# Patient Record
Sex: Female | Born: 1970 | Race: White | Hispanic: Refuse to answer | Marital: Married | Smoking: Never smoker
Health system: Southern US, Community
[De-identification: ages and names within clinical notes are randomized; demographics above are authoritative.]

## PROBLEM LIST (undated history)

## (undated) DIAGNOSIS — I10 Essential (primary) hypertension: Secondary | ICD-10-CM

## (undated) DIAGNOSIS — L0291 Cutaneous abscess, unspecified: Secondary | ICD-10-CM

## (undated) DIAGNOSIS — G43909 Migraine, unspecified, not intractable, without status migrainosus: Secondary | ICD-10-CM

## (undated) HISTORY — DX: Migraine, unspecified, not intractable, without status migrainosus: G43.909

## (undated) HISTORY — DX: Essential (primary) hypertension: I10

---

## 2004-04-24 HISTORY — PX: CHOLECYSTECTOMY: SHX55

## 2004-04-24 HISTORY — PX: TUBAL LIGATION: SHX77

## 2014-03-11 ENCOUNTER — Ambulatory Visit: Payer: Self-pay | Admitting: Physician Assistant

## 2014-06-24 LAB — HEMOGLOBIN A1C: Hgb A1c MFr Bld: 6 % (ref 4.0–6.0)

## 2014-08-06 ENCOUNTER — Encounter: Payer: Self-pay | Admitting: Family Medicine

## 2014-08-06 DIAGNOSIS — I10 Essential (primary) hypertension: Secondary | ICD-10-CM | POA: Insufficient documentation

## 2014-08-24 LAB — LIPID PANEL
Cholesterol: 268 mg/dL — AB (ref 0–200)
HDL: 62 mg/dL (ref 35–70)
LDL CALC: 184 mg/dL
TRIGLYCERIDES: 108 mg/dL (ref 40–160)

## 2014-11-24 ENCOUNTER — Encounter: Payer: Self-pay | Admitting: Family Medicine

## 2014-11-24 ENCOUNTER — Ambulatory Visit (INDEPENDENT_AMBULATORY_CARE_PROVIDER_SITE_OTHER): Payer: 59 | Admitting: Family Medicine

## 2014-11-24 VITALS — BP 102/64 | HR 72 | Ht 59.0 in | Wt 211.4 lb

## 2014-11-24 DIAGNOSIS — R7303 Prediabetes: Secondary | ICD-10-CM | POA: Insufficient documentation

## 2014-11-24 DIAGNOSIS — R7309 Other abnormal glucose: Secondary | ICD-10-CM

## 2014-11-24 DIAGNOSIS — E559 Vitamin D deficiency, unspecified: Secondary | ICD-10-CM | POA: Diagnosis not present

## 2014-11-24 DIAGNOSIS — I10 Essential (primary) hypertension: Secondary | ICD-10-CM

## 2014-11-24 DIAGNOSIS — R718 Other abnormality of red blood cells: Secondary | ICD-10-CM

## 2014-11-24 MED ORDER — LISINOPRIL-HYDROCHLOROTHIAZIDE 20-25 MG PO TABS: 1.0000 | ORAL_TABLET | Freq: Every day | ORAL | Status: DC

## 2014-11-24 NOTE — Progress Notes (Signed)
Date:  11/24/2014   Name:  Nicole Vargas   DOB:  [birth year]   MRN:  [id number]  PCP:  Schuyler Amor, MD    Chief Complaint: Hypertension   History of Present Illness:  This is a 44 y.o. female for f/u HTN, prediabetes, vit d deficiency. Rare lightheadness when leans over, otherwise tolerating well. Has not changed diet or exercise regimens. Taking vit d supplement daily.   Review of Systems:  Review of Systems  Respiratory: Negative for shortness of breath.   Cardiovascular: Negative for chest pain and leg swelling.    Patient Active Problem List   Diagnosis Date Noted  . Vitamin D deficiency 11/24/2014  . Prediabetes 11/24/2014  . Essential (primary) hypertension 08/06/2014  . Extreme obesity 08/06/2014    Prior to Admission medications   Medication Sig Start Date End Date Taking? Authorizing Provider  Cholecalciferol (VITAMIN D) 2000 UNITS tablet Take 2,000 Units by mouth daily.   Yes Historical Provider, MD  lisinopril-hydrochlorothiazide (PRINZIDE,ZESTORETIC) 20-25 MG per tablet Take 1 tablet by mouth daily. 11/24/14  Yes Schuyler Amor, MD    No Known Allergies  Past Surgical History  Procedure Laterality Date  . Tubal ligation  2006  . Cholecystectomy  2006  . Cesarean section  03/04/2000    History  Substance Use Topics  . Smoking status: Never Smoker   . Smokeless tobacco: Not on file  . Alcohol Use: No    Family History  Problem Relation Age of Onset  . Diabetes Maternal Grandmother   . Heart disease Paternal Grandfather     Medication list has been reviewed and updated.  Physical Examination: BP 102/64 mmHg  Pulse 72  Ht  (1.499 m)  Wt 211 lb 6.4 oz (95.89 kg)  BMI 42.67 kg/m2  LMP 10/23/2014 (Approximate)  Physical Exam  Constitutional: She appears well-developed and well-nourished.  Cardiovascular: Normal rate, regular rhythm and normal heart sounds.   Pulmonary/Chest: Effort normal and breath sounds normal.   Musculoskeletal: She exhibits no edema.    Assessment and Plan:  1. Essential (primary) hypertension Well controlled, continue current med, call if lightheadedness worsens - Basic Metabolic Panel (BMET)  2. Obesity, morbid, BMI 40.0-49.9 Discussed diet, weight loss - TSH  3. Vitamin D deficiency Continue supplement - Vitamin D (25 hydroxy)  4. Prediabetes Recheck a1c, discussed diabetes prevention strategies - HgB A1c  5. Microcytosis No anemia, check for iron deficiency - Ferritin  Return in about 6 months (around 05/27/2015).  Dionne Ano. Kingsley Spittle MD [facility]  11/24/2014

## 2014-11-25 LAB — BASIC METABOLIC PANEL WITH GFR
BUN/Creatinine Ratio: 21 (ref 9–23)
BUN: 16 mg/dL (ref 6–24)
CO2: 20 mmol/L (ref 18–29)
Calcium: 9.4 mg/dL (ref 8.7–10.2)
Chloride: 100 mmol/L (ref 97–108)
Creatinine, Ser: 0.75 mg/dL (ref 0.57–1.00)
GFR calc Af Amer: 114 mL/min/1.73
GFR calc non Af Amer: 99 mL/min/1.73
Glucose: 82 mg/dL (ref 65–99)
Potassium: 4.5 mmol/L (ref 3.5–5.2)
Sodium: 140 mmol/L (ref 134–144)

## 2014-11-25 LAB — TSH: TSH: 2.01 u[IU]/mL (ref 0.450–4.500)

## 2014-11-25 LAB — FERRITIN: Ferritin: 13 ng/mL — ABNORMAL LOW (ref 15–150)

## 2014-11-25 LAB — HEMOGLOBIN A1C
Est. average glucose Bld gHb Est-mCnc: 123 mg/dL
Hgb A1c MFr Bld: 5.9 % — ABNORMAL HIGH (ref 4.8–5.6)

## 2014-11-25 LAB — VITAMIN D 25 HYDROXY (VIT D DEFICIENCY, FRACTURES): Vit D, 25-Hydroxy: 27.5 ng/mL — ABNORMAL LOW (ref 30.0–100.0)

## 2015-07-05 ENCOUNTER — Ambulatory Visit (INDEPENDENT_AMBULATORY_CARE_PROVIDER_SITE_OTHER): Payer: 59 | Admitting: Family Medicine

## 2015-07-05 ENCOUNTER — Encounter: Payer: Self-pay | Admitting: Family Medicine

## 2015-07-05 VITALS — BP 119/76 | HR 100 | Ht 59.0 in | Wt 226.8 lb

## 2015-07-05 DIAGNOSIS — E559 Vitamin D deficiency, unspecified: Secondary | ICD-10-CM | POA: Diagnosis not present

## 2015-07-05 DIAGNOSIS — R0683 Snoring: Secondary | ICD-10-CM

## 2015-07-05 DIAGNOSIS — I10 Essential (primary) hypertension: Secondary | ICD-10-CM | POA: Diagnosis not present

## 2015-07-05 DIAGNOSIS — E611 Iron deficiency: Secondary | ICD-10-CM

## 2015-07-05 DIAGNOSIS — R1013 Epigastric pain: Secondary | ICD-10-CM | POA: Diagnosis not present

## 2015-07-05 DIAGNOSIS — R7303 Prediabetes: Secondary | ICD-10-CM

## 2015-07-05 DIAGNOSIS — M545 Low back pain: Secondary | ICD-10-CM | POA: Diagnosis not present

## 2015-07-05 HISTORY — DX: Iron deficiency: E61.1

## 2015-07-05 LAB — POCT URINALYSIS DIPSTICK
Bilirubin, UA: NEGATIVE
Blood, UA: NEGATIVE
GLUCOSE UA: NEGATIVE
Ketones, UA: NEGATIVE
Leukocytes, UA: NEGATIVE
NITRITE UA: NEGATIVE
Protein, UA: NEGATIVE
Spec Grav, UA: 1.01
UROBILINOGEN UA: 0.2
pH, UA: 7

## 2015-07-05 MED ORDER — VITAMIN D 50 MCG (2000 UT) PO CAPS: 1.0000 | ORAL_CAPSULE | Freq: Every day | ORAL | Status: AC

## 2015-07-05 MED ORDER — FERROUS SULFATE 325 (65 FE) MG PO TBEC: 325.0000 mg | DELAYED_RELEASE_TABLET | Freq: Every day | ORAL | Status: AC

## 2015-07-05 NOTE — Patient Instructions (Signed)

## 2015-07-06 LAB — CBC
HEMATOCRIT: 39 % (ref 34.0–46.6)
HEMOGLOBIN: 12.9 g/dL (ref 11.1–15.9)
MCH: 28.4 pg (ref 26.6–33.0)
MCHC: 33.1 g/dL (ref 31.5–35.7)
MCV: 86 fL (ref 79–97)
Platelets: 290 10*3/uL (ref 150–379)
RBC: 4.54 x10E6/uL (ref 3.77–5.28)
RDW: 13.7 % (ref 12.3–15.4)
WBC: 9 10*3/uL (ref 3.4–10.8)

## 2015-07-06 LAB — COMPREHENSIVE METABOLIC PANEL
ALBUMIN: 4.1 g/dL (ref 3.5–5.5)
ALK PHOS: 32 IU/L — AB (ref 39–117)
ALT: 13 IU/L (ref 0–32)
AST: 13 IU/L (ref 0–40)
Albumin/Globulin Ratio: 1.6 (ref 1.2–2.2)
BUN/Creatinine Ratio: 18 (ref 9–23)
BUN: 12 mg/dL (ref 6–24)
Bilirubin Total: 0.2 mg/dL (ref 0.0–1.2)
CALCIUM: 9.4 mg/dL (ref 8.7–10.2)
CO2: 25 mmol/L (ref 18–29)
CREATININE: 0.68 mg/dL (ref 0.57–1.00)
Chloride: 101 mmol/L (ref 96–106)
GFR calc Af Amer: 124 mL/min/{1.73_m2} (ref >59)
GFR calc non Af Amer: 107 mL/min/{1.73_m2} (ref >59)
Globulin, Total: 2.5 g/dL (ref 1.5–4.5)
Glucose: 96 mg/dL (ref 65–99)
Potassium: 4.6 mmol/L (ref 3.5–5.2)
Sodium: 141 mmol/L (ref 134–144)
Total Protein: 6.6 g/dL (ref 6.0–8.5)

## 2015-07-06 LAB — HEMOGLOBIN A1C
Est. average glucose Bld gHb Est-mCnc: 120 mg/dL
Hgb A1c MFr Bld: 5.8 % — ABNORMAL HIGH (ref 4.8–5.6)

## 2015-07-06 LAB — FERRITIN: FERRITIN: 25 ng/mL (ref 15–150)

## 2015-07-06 LAB — VITAMIN D 25 HYDROXY (VIT D DEFICIENCY, FRACTURES): VIT D 25 HYDROXY: 24.7 ng/mL — AB (ref 30.0–100.0)

## 2015-07-06 NOTE — Progress Notes (Signed)
Date:  07/05/2015   Name:  Nicole Vargas   DOB:  1970/05/20   MRN:  [id number]  PCP:  Schuyler Amor, MD    Chief Complaint: Abdominal Pain   History of Present Illness:  This is a 45 y.o. female with epigastric pain this morning after eating breakfast, some radiation to upper back, no associated cardiac sxs, no similar prior sxs. Better now without intervention. Hx HTN well controlled on Prinzide (lightheadedness resolved), vit D def (not taking supplement currently), prediabetes (a1c 5.9% in August), and iron def (not taking supplement currently). Also c/o snoring loudly at night and being tired all the time.  Review of Systems:  Review of Systems  Constitutional: Negative for fever.  Respiratory: Negative for shortness of breath.   Cardiovascular: Negative for chest pain and leg swelling.  Gastrointestinal: Negative for nausea, vomiting, diarrhea, constipation and abdominal distention.  Endocrine: Negative for polyuria.  Genitourinary: Negative for difficulty urinating.  Neurological: Negative for syncope and light-headedness.    Patient Active Problem List   Diagnosis Date Noted  . Iron deficiency 07/05/2015  . Vitamin D deficiency 11/24/2014  . Prediabetes 11/24/2014  . Essential (primary) hypertension 08/06/2014  . Obesity, Class III, BMI 40-49.9 (morbid obesity) (HCC) 08/06/2014    Prior to Admission medications   Medication Sig Start Date End Date Taking? Authorizing Provider  lisinopril-hydrochlorothiazide (PRINZIDE,ZESTORETIC) 20-25 MG per tablet Take 1 tablet by mouth daily. 11/24/14  Yes Schuyler Amor, MD  Cholecalciferol (VITAMIN D) 2000 units CAPS Take 1 capsule (2,000 Units total) by mouth daily. 07/05/15   Schuyler Amor, MD  ferrous sulfate 325 (65 FE) MG EC tablet Take 1 tablet (325 mg total) by mouth daily with breakfast. 07/05/15   Schuyler Amor, MD    No Known Allergies  Past Surgical History  Procedure Laterality Date  . Tubal ligation  2006  .  Cholecystectomy  2006  . Cesarean section  03/04/2000    Social History  Substance Use Topics  . Smoking status: Never Smoker   . Smokeless tobacco: None  . Alcohol Use: No    Family History  Problem Relation Age of Onset  . Diabetes Maternal Grandmother   . Heart disease Paternal Grandfather     Medication list has been reviewed and updated.  Physical Examination: BP 119/76 mmHg  Pulse 100  Ht  (1.499 m)  Wt 226 lb 12.8 oz (102.876 kg)  BMI 45.78 kg/m2  LMP 06/24/2015  Physical Exam  Constitutional: She appears well-developed and well-nourished.  Cardiovascular: Normal rate, regular rhythm and normal heart sounds.   Pulmonary/Chest: Effort normal and breath sounds normal.  Abdominal: Soft. She exhibits no distension and no mass. There is no rebound and no guarding.  Mild suprapubic tenderness  Musculoskeletal: She exhibits no edema.  Neurological: She is alert.  Skin: Skin is warm and dry.  Psychiatric: She has a normal mood and affect. Her behavior is normal.  Nursing note and vitals reviewed.   Assessment and Plan:  1. Epigastric pain Unclear etiology, possible GB dz but acute and resolving spontaneously, recommend antacids prn, consider [place] if sxs recur  2. Low back pain, unspecified back pain laterality, with sciatica presence unspecified UA negative - POCT Urinalysis Dipstick - CBC  3. Essential (primary) hypertension Well controlled on Prinzide - Comprehensive Metabolic Panel (CMET)  4. Obesity, Class III, BMI 40-49.9 (morbid obesity) (HCC) Weight up 15# from last visit, exercise/weight loss discussed  5. Vitamin D deficiency Restart supplement - Vitamin D (25 hydroxy)  6. Prediabetes - HgB A1c  7. Iron deficiency Restart supplement - Ferritin  8. Snoring With daytime fatigue - Ambulatory referral to Sleep Studies  Return in about 6 months (around 01/05/2016).  Dionne AnoWilliam M. Kingsley SpittlePlonk, Jr. MD [facility]  07/06/2015

## 2015-07-20 ENCOUNTER — Telehealth: Payer: Self-pay

## 2015-07-20 NOTE — Telephone Encounter (Signed)
Sent to [facility] 

## 2015-10-12 ENCOUNTER — Ambulatory Visit (INDEPENDENT_AMBULATORY_CARE_PROVIDER_SITE_OTHER): Payer: 59 | Admitting: Family Medicine

## 2015-10-12 ENCOUNTER — Encounter: Payer: Self-pay | Admitting: Family Medicine

## 2015-10-12 VITALS — BP 98/78 | HR 81 | Temp 98.6°F | Resp 16 | Ht 59.0 in | Wt 226.0 lb

## 2015-10-12 DIAGNOSIS — R7303 Prediabetes: Secondary | ICD-10-CM | POA: Diagnosis not present

## 2015-10-12 DIAGNOSIS — I10 Essential (primary) hypertension: Secondary | ICD-10-CM | POA: Diagnosis not present

## 2015-10-12 DIAGNOSIS — E611 Iron deficiency: Secondary | ICD-10-CM

## 2015-10-12 DIAGNOSIS — E559 Vitamin D deficiency, unspecified: Secondary | ICD-10-CM | POA: Diagnosis not present

## 2015-10-12 DIAGNOSIS — L723 Sebaceous cyst: Secondary | ICD-10-CM

## 2015-10-12 MED ORDER — LISINOPRIL-HYDROCHLOROTHIAZIDE 20-25 MG PO TABS: 1.0000 | ORAL_TABLET | Freq: Every day | ORAL | Status: DC

## 2015-10-12 NOTE — Progress Notes (Signed)
Date:  10/12/2015   Name:  Nicole Vargas   DOB:  03/28/1971   MRN:  [id number]  PCP:  Schuyler AmorWilliam Bradyn Soward, MD    Chief Complaint: Mass   History of Present Illness:  This is a 45 y.o. female with mass R axilla past month, stable, not painful. Taking Prinzide for HTN and vit D and iron supplements. Periods about the same.   Review of Systems:  Review of Systems  Constitutional: Negative for fever and fatigue.  Respiratory: Negative for cough and shortness of breath.   Cardiovascular: Negative for chest pain and leg swelling.  Endocrine: Negative for polyuria.  Genitourinary: Negative for difficulty urinating.  Neurological: Negative for syncope and light-headedness.    Patient Active Problem List   Diagnosis Date Noted  . Iron deficiency 07/05/2015  . Vitamin D deficiency 11/24/2014  . Prediabetes 11/24/2014  . Essential (primary) hypertension 08/06/2014  . Obesity, Class III, BMI 40-49.9 (morbid obesity) (HCC) 08/06/2014    Prior to Admission medications   Medication Sig Start Date End Date Taking? Authorizing Provider  Cholecalciferol (VITAMIN D) 2000 units CAPS Take 1 capsule (2,000 Units total) by mouth daily. 07/05/15  Yes Schuyler AmorWilliam Pierce Biagini, MD  ferrous sulfate 325 (65 FE) MG EC tablet Take 1 tablet (325 mg total) by mouth daily with breakfast. 07/05/15  Yes Schuyler AmorWilliam Joneisha Miles, MD  lisinopril-hydrochlorothiazide (PRINZIDE,ZESTORETIC) 20-25 MG tablet Take 1 tablet by mouth daily. 10/12/15  Yes Schuyler AmorWilliam Furious Chiarelli, MD    No Known Allergies  Past Surgical History  Procedure Laterality Date  . Tubal ligation  2006  . Cholecystectomy  2006  . Cesarean section  03/04/2000    Social History  Substance Use Topics  . Smoking status: Never Smoker   . Smokeless tobacco: None  . Alcohol Use: No    Family History  Problem Relation Age of Onset  . Diabetes Maternal Grandmother   . Heart disease Paternal Grandfather     Medication list has been reviewed and updated.  Physical  Examination: BP 98/78 mmHg  Pulse 81  Temp(Src) 98.6 F (37 C) (Oral)  Resp 16  Ht 4\' 11"  (1.499 m)  Wt 226 lb (102.513 kg)  BMI 45.62 kg/m2  SpO2 96%  Physical Exam  Constitutional: She appears well-developed and well-nourished.  Cardiovascular: Normal rate, regular rhythm and normal heart sounds.   Pulmonary/Chest: Effort normal and breath sounds normal.  Musculoskeletal: She exhibits no edema.  Neurological: She is alert.  Skin: Skin is warm and dry.  1 cm cyst R axilla, nontender, mild erythema  Psychiatric: She has a normal mood and affect. Her behavior is normal.  Nursing note and vitals reviewed.   Assessment and Plan:  1. Sebaceous cyst of right axilla Stable, sxs infection discussed  2. Essential (primary) hypertension Well controlled on Prinzide  3. Prediabetes - HgB A1c  4. Vitamin D deficiency On supplement - Vitamin D (25 hydroxy)  5. Iron deficiency On supplement - Ferritin  6. Obesity, Class III, BMI 40-49.9 (morbid obesity) (HCC) Stable, exercise/weight loss discussed  Return in about 6 months (around 04/12/2016).  Dionne AnoWilliam M. Kingsley SpittlePlonk, Jr. MD [facility]  10/12/2015

## 2015-10-13 LAB — HEMOGLOBIN A1C
Est. average glucose Bld gHb Est-mCnc: 111 mg/dL
HEMOGLOBIN A1C: 5.5 % (ref 4.8–5.6)

## 2015-10-13 LAB — FERRITIN: Ferritin: 67 ng/mL (ref 15–150)

## 2015-10-13 LAB — VITAMIN D 25 HYDROXY (VIT D DEFICIENCY, FRACTURES): Vit D, 25-Hydroxy: 34.8 ng/mL (ref 30.0–100.0)

## 2015-12-10 ENCOUNTER — Ambulatory Visit
Admission: EM | Admit: 2015-12-10 | Discharge: 2015-12-10 | Disposition: A | Discharge status: Home / Self Care | Payer: 59 | Attending: Internal Medicine | Admitting: Internal Medicine

## 2015-12-10 DIAGNOSIS — L03312 Cellulitis of back [any part except buttock]: Secondary | ICD-10-CM

## 2015-12-10 MED ORDER — SULFAMETHOXAZOLE-TRIMETHOPRIM 800-160 MG PO TABS: 1.0000 | ORAL_TABLET | Freq: Two times a day (BID) | ORAL | 0 refills | Status: DC

## 2015-12-10 NOTE — ED Triage Notes (Signed)
Patient complains of skin lesion located on left mid back near bra line. Patient states that area has been painful,swollen and red. Patient denies any recent insect bites.

## 2015-12-10 NOTE — Discharge Instructions (Signed)
Put heat on sore spot, 10-15 minutes at a time 2-4 times daily, to see if it will help resolve the sore spot, or bring it to a head.  Prescription for antibiotic sent to the [facility] in [place].  Recheck or followup with PCP/Dr Judithann GravesBerglund if not improving in a few days.

## 2015-12-10 NOTE — ED Provider Notes (Signed)
[facility]    CSN: [id number] Arrival date & time: 12/10/15  [id number]  First Provider Contact:  First MD Initiated Contact with Patient 12/10/15 1023        History   Chief Complaint Chief Complaint  Patient presents with  . Skin Problem    HPI Nicole Vargas is a 45 y.o. female. She presents today with a 2-3 day history of sore red place in her left mid back, in the bra line. 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 No fever, no rash or sore places elsewhere. No malaise. The lesion is not resolving, so she is here to have it checked out. No prior history of boils or shingles.  HPI  Past Medical History:  Diagnosis Date  . Hypertension     Patient Active Problem List   Diagnosis Date Noted  . Iron deficiency 07/05/2015  . Vitamin D deficiency 11/24/2014  . Prediabetes 11/24/2014  . Essential (primary) hypertension 08/06/2014  . Obesity, Class III, BMI 40-49.9 (morbid obesity) (HCC) 08/06/2014    Past Surgical History:  Procedure Laterality Date  . CESAREAN SECTION  03/04/2000  . CHOLECYSTECTOMY  2006  . TUBAL LIGATION  2006     Home Medications    Prior to Admission medications   Medication Sig Start Date End Date Taking? Authorizing Provider  Cholecalciferol (VITAMIN D) 2000 units CAPS Take 1 capsule (2,000 Units total) by mouth daily. 07/05/15  Yes Schuyler AmorWilliam [facility], MD  ferrous sulfate 325 (65 FE) MG EC tablet Take 1 tablet (325 mg total) by mouth daily with breakfast. 07/05/15  Yes Schuyler AmorWilliam [facility], MD  lisinopril-hydrochlorothiazide (PRINZIDE,ZESTORETIC) 20-25 MG tablet Take 1 tablet by mouth daily. 10/12/15  Yes Schuyler AmorWilliam [facility], MD    Family History Family History  Problem Relation Age of Onset  . Diabetes Maternal Grandmother   . Heart disease Paternal Grandfather     Social History Social History  Substance Use Topics  . Smoking status: Never Smoker  .  Smokeless tobacco: Never Used  . Alcohol use No     Allergies   Review of patient's allergies indicates no known allergies.   Review of Systems Review of Systems  All other systems reviewed and are negative.    Physical Exam Triage Vital Signs ED Triage Vitals  Enc Vitals Group     BP 12/10/15 1006 110/67     Pulse Rate 12/10/15 1006 82     Resp 12/10/15 1006 17     Temp 12/10/15 1006 98.3 F (36.8 C)     Temp Source 12/10/15 1006 Tympanic     SpO2 12/10/15 1006 98 %     Weight 12/10/15 1003 223 lb (101.2 kg)     Height 12/10/15 1003 4\' 11"  (1.499 m)     Pain Score 12/10/15 1005 3   Updated Vital Signs BP 110/67 (BP Location: Left Arm)   Pulse 82   Temp 98.3 F (36.8 C) (Tympanic)   Resp 17   Ht 4\' 11"  (1.499 m)   Wt 223 lb (101.2 kg)   LMP 11/28/2015   SpO2 98%   BMI 45.04 kg/m  Physical Exam  Constitutional: She is oriented to person, place, and time. No distress.  Alert, nicely groomed  HENT:  Head: Atraumatic.  Eyes:  Conjugate gaze, no eye redness/drainage  Neck: Neck supple.  Cardiovascular: Normal rate.   Pulmonary/Chest: No respiratory distress.  Abdominal:  She exhibits no distension.  Musculoskeletal: Normal range of motion.  No leg swelling  Neurological: She is alert and oriented to person, place, and time.  Skin: Skin is warm and dry.  No cyanosis 2 inch indurated red patch in the left bra line posteriorly, with surrounding zone of erythema that extends to 3 inches total. Couple of inflamed blackheads in the periphery of this lesion, but not pointing, not fluctuant. It is tender. No other rash or lesion appreciated.  Nursing note and vitals reviewed.    UC Treatments / Results   Procedures Procedures (including critical care time) none  Final Clinical Impressions(s) / UC Diagnoses   Final diagnoses:  Cellulitis of mid back region   Apply heat for 10-15 minutes 2-4 times daily, to try to improve the lesion or bring it to a head.  Recheck in a few days, or follow up with PCP, if not improving.  New Prescriptions Discharge Medication List as of 12/10/2015 10:35 AM    START taking these medications   Details  sulfamethoxazole-trimethoprim (BACTRIM DS,SEPTRA DS) 800-160 MG tablet Take 1 tablet by mouth 2 (two) times daily., Starting Fri 12/10/2015, Until Fri 12/17/2015, Normal         Eustace MooreLaura W Jerzy Crotteau, MD 12/10/15 [phone]

## 2015-12-14 ENCOUNTER — Ambulatory Visit
Admission: EM | Admit: 2015-12-14 | Discharge: 2015-12-14 | Disposition: A | Discharge status: Home / Self Care | Payer: 59 | Attending: Family Medicine | Admitting: Family Medicine

## 2015-12-14 DIAGNOSIS — L0390 Cellulitis, unspecified: Secondary | ICD-10-CM

## 2015-12-14 DIAGNOSIS — L0291 Cutaneous abscess, unspecified: Secondary | ICD-10-CM | POA: Diagnosis not present

## 2015-12-14 MED ORDER — MUPIROCIN 2 % EX OINT: 1.0000 "application " | TOPICAL_OINTMENT | Freq: Three times a day (TID) | CUTANEOUS | 0 refills | Status: DC

## 2015-12-14 MED ORDER — CLINDAMYCIN HCL 300 MG PO CAPS: 300.0000 mg | ORAL_CAPSULE | Freq: Three times a day (TID) | ORAL | 0 refills | Status: DC

## 2015-12-14 NOTE — ED Provider Notes (Signed)
CSN: [id number]     Arrival date & time 12/14/15  0941 History   None    Chief Complaint  Patient presents with  . Recurrent Skin Infections    Back   (Consider location/radiation/quality/duration/timing/severity/associated sxs/prior Treatment) HPI  This is a 45 year old female returns after being seen on 12/10/2015 by Dr. Dayton ScrapeMurray. At that time a abscess on her back had not pointed and was not fluctuant and therefore not amenable to I&D. Place on warm compresses which she has been performing 2-3 times a day and on Septra DS which she has taken but missed the dose this morning. She states that despite all the instructions as she feels that it is and isn't improving and in fact is worsening. She denies any fever or chills. She attempted to proceed Dr Hollace HaywardPlonk. today but they sent her down here because they don't perform I&D's in their office.    Past Medical History:  Diagnosis Date  . Hypertension    Past Surgical History:  Procedure Laterality Date  . CESAREAN SECTION  03/04/2000  . CHOLECYSTECTOMY  2006  . TUBAL LIGATION  2006   Family History  Problem Relation Age of Onset  . Diabetes Maternal Grandmother   . Heart disease Paternal Grandfather    Social History  Substance Use Topics  . Smoking status: Never Smoker  . Smokeless tobacco: Never Used  . Alcohol use No   OB History    No data available     Review of Systems  Constitutional: Positive for activity change. Negative for chills, fatigue and fever.  Skin: Positive for wound.  All other systems reviewed and are negative.   Allergies  Review of patient's allergies indicates no known allergies.  Home Medications   Prior to Admission medications   Medication Sig Start Date End Date Taking? Authorizing Provider  Cholecalciferol (VITAMIN D) 2000 units CAPS Take 1 capsule (2,000 Units total) by mouth daily. 07/05/15   Schuyler AmorWilliam [facility], MD  clindamycin (CLEOCIN) 300 MG capsule Take 1 capsule (300 mg total) by mouth 3  (three) times daily. 12/14/15   Lutricia FeilWilliam P Venida Tsukamoto, PA-C  ferrous sulfate 325 (65 FE) MG EC tablet Take 1 tablet (325 mg total) by mouth daily with breakfast. 07/05/15   Schuyler AmorWilliam [facility], MD  lisinopril-hydrochlorothiazide (PRINZIDE,ZESTORETIC) 20-25 MG tablet Take 1 tablet by mouth daily. 10/12/15   Schuyler AmorWilliam [facility], MD  mupirocin ointment (BACTROBAN) 2 % Apply 1 application topically 3 (three) times daily. 12/14/15   Lutricia FeilWilliam P Marston Mccadden, PA-C  sulfamethoxazole-trimethoprim (BACTRIM DS,SEPTRA DS) 800-160 MG tablet Take 1 tablet by mouth 2 (two) times daily. 12/10/15 12/17/15  Eustace MooreLaura W Murray, MD   Meds Ordered and Administered this Visit  Medications - No data to display  BP 110/74 (BP Location: Left Arm)   Pulse 95   Resp 18   Ht 4\' 11"  (1.499 m)   Wt 223 lb (101.2 kg)   LMP 11/28/2015   SpO2 99%   BMI 45.04 kg/m  No data found.   Physical Exam  Constitutional: She is oriented to person, place, and time. She appears well-developed and well-nourished. No distress.  HENT:  Head: Normocephalic and atraumatic.  Eyes: EOM are normal. Pupils are equal, round, and reactive to light.  Neck: Normal range of motion. Neck supple.  Musculoskeletal: Normal range of motion. She exhibits no edema, tenderness or deformity.  Neurological: She is alert and oriented to person, place, and time.  Skin: Skin is warm and dry. She is not diaphoretic.  Examination was  performed with Herbert SetaHeather, RN as Nurse, children'schaperone and assistant. The abscess over the lateral posterior chest in the posterior axillary line has a small pustule over the midportion. Is still indurated particularly superiorly and very erythematous in an area measuring approximately 5 cm x 4 cm. Is no fluctuance present. Is in the bra line and because of her pendulous breasts is irritating the area. She states that she is faithfully taking the Septra but does not seem to be responding well. The area has not pointed sufficiently enough for I&D.  Psychiatric: She has a normal  mood and affect. Her behavior is normal. Judgment and thought content normal.  Nursing note and vitals reviewed.   Urgent Care Course   Clinical Course    Procedures (including critical care time)  Labs Review Labs Reviewed - No data to display  Imaging Review No results found.   Visual Acuity Review  Right Eye Distance:   Left Eye Distance:   Bilateral Distance:    Right Eye Near:   Left Eye Near:    Bilateral Near:     Procedure: The abscess was thoroughly cleansed with Hibiclens shin. Gauge needle was then utilized to remove the pustule at the return of a small amount of purulent and serosanguineous fluid. The area was squeezed without any further purulence being pressed. The area of erythema extends beyond the area of induration is warm tender and blanchable.    MDM   1. Abscess and cellulitis    Discharge Medication List as of 12/14/2015 12:02 PM    START taking these medications   Details  clindamycin (CLEOCIN) 300 MG capsule Take 1 capsule (300 mg total) by mouth 3 (three) times daily., Starting Tue 12/14/2015, Normal    mupirocin ointment (BACTROBAN) 2 % Apply 1 application topically 3 (three) times daily., Starting Tue 12/14/2015, Normal      Plan: 1. Test/x-ray results and diagnosis reviewed with patient 2. rx as per orders; risks, benefits, potential side effects reviewed with patient 3. Recommend supportive treatment with continued aggressive warm compresses 4 times daily lasting 10 minutes each time. She will then dry the area and apply Bactroban after each warm compress. I'll continue her with the Septra but in order to augment resolution of the abscess I will place her on clindamycin 300 mg 3 times a day. She states that she is not able to work and not wear a bra so I will give her 2 days off work until I see her back in 48 hours. Should help with the irritation caused by the bra. 4. F/u prn if symptoms worsen or don't improve     Lutricia FeilWilliam P Albert Hersch,  PA-C 12/14/15 [address][phone]    Keiston Manley P Sw[place] MeadowsRoemer, [place]PA-C 12/14/15 1216

## 2015-12-14 NOTE — ED Triage Notes (Signed)
Patient was seen on Friday for a boil on her back, she was given an antibiotic and she has been taking it correctly. She was told if it wasn't better to come back on Tuesday. Patient states that the boil has started to spread and hurt worse.

## 2015-12-16 ENCOUNTER — Encounter: Payer: Self-pay | Admitting: *Deleted

## 2015-12-16 ENCOUNTER — Ambulatory Visit
Admission: EM | Admit: 2015-12-16 | Discharge: 2015-12-16 | Disposition: A | Discharge status: Home / Self Care | Payer: 59 | Attending: Family Medicine | Admitting: Family Medicine

## 2015-12-16 DIAGNOSIS — I10 Essential (primary) hypertension: Secondary | ICD-10-CM | POA: Diagnosis not present

## 2015-12-16 DIAGNOSIS — L0390 Cellulitis, unspecified: Secondary | ICD-10-CM | POA: Insufficient documentation

## 2015-12-16 DIAGNOSIS — L0291 Cutaneous abscess, unspecified: Secondary | ICD-10-CM | POA: Diagnosis not present

## 2015-12-16 MED ORDER — SULFAMETHOXAZOLE-TRIMETHOPRIM 800-160 MG PO TABS: 1.0000 | ORAL_TABLET | Freq: Two times a day (BID) | ORAL | 0 refills | Status: AC

## 2015-12-16 NOTE — ED Provider Notes (Signed)
CSN: [id number]     Arrival date & time 12/16/15  0840 History   First MD Initiated Contact with Patient 12/16/15 [phone]     Chief Complaint  Patient presents with  . Abscess   (Consider location/radiation/quality/duration/timing/severity/associated sxs/prior Treatment) HPI  This 45 year old female presents after being seen 2 days ago with a large abscess and cellulitis of her left posterior thorax was placed on warm compresses and additional antibiotic medication including Bactroban ointment and clindamycin by mouth. Is been following my instructions and returns today for follow-up. The abscess has now begun to point with a large amount of fluctuance felt and less induration. Is also much less tender although the cellulitis appears to have not decreased but certainly has not increased in size. She is afebrile   Past Medical History:  Diagnosis Date  . Hypertension    Past Surgical History:  Procedure Laterality Date  . CESAREAN SECTION  03/04/2000  . CHOLECYSTECTOMY  2006  . TUBAL LIGATION  2006   Family History  Problem Relation Age of Onset  . Diabetes Maternal Grandmother   . Heart disease Paternal Grandfather    Social History  Substance Use Topics  . Smoking status: Never Smoker  . Smokeless tobacco: Never Used  . Alcohol use No   OB History    No data available     Review of Systems  Constitutional: Positive for activity change. Negative for chills, fatigue and fever.  Skin: Positive for color change and wound.  All other systems reviewed and are negative.   Allergies  Review of patient's allergies indicates no known allergies.  Home Medications   Prior to Admission medications   Medication Sig Start Date End Date Taking? Authorizing Provider  Cholecalciferol (VITAMIN D) 2000 units CAPS Take 1 capsule (2,000 Units total) by mouth daily. 07/05/15  Yes Schuyler AmorWilliam Plonk, MD  clindamycin (CLEOCIN) 300 MG capsule Take 1 capsule (300 mg total) by mouth 3 (three) times  daily. 12/14/15  Yes Lutricia FeilWilliam P Lacorey Brusca, PA-C  ferrous sulfate 325 (65 FE) MG EC tablet Take 1 tablet (325 mg total) by mouth daily with breakfast. 07/05/15  Yes Schuyler AmorWilliam Plonk, MD  lisinopril-hydrochlorothiazide (PRINZIDE,ZESTORETIC) 20-25 MG tablet Take 1 tablet by mouth daily. 10/12/15  Yes Schuyler AmorWilliam Plonk, MD  mupirocin ointment (BACTROBAN) 2 % Apply 1 application topically 3 (three) times daily. 12/14/15  Yes Lutricia FeilWilliam P Elysa Womac, PA-C  sulfamethoxazole-trimethoprim (BACTRIM DS,SEPTRA DS) 800-160 MG tablet Take 1 tablet by mouth 2 (two) times daily. 12/16/15 12/19/15  Lutricia FeilWilliam P Brennley Curtice, PA-C   Meds Ordered and Administered this Visit  Medications - No data to display  BP 115/83 (BP Location: Left Arm)   Pulse 82   Temp 97.7 F (36.5 C) (Oral)   Resp 16   Ht 4\' 10"  (1.473 m)   Wt 230 lb (104.3 kg)   LMP 11/28/2015   SpO2 100%   BMI 48.07 kg/m  No data found.   Physical Exam  Constitutional: She is oriented to person, place, and time. She appears well-developed and well-nourished. No distress.  HENT:  Head: Normocephalic and atraumatic.  Eyes: EOM are normal. Pupils are equal, round, and reactive to light.  Neck: Normal range of motion. Neck supple.  Musculoskeletal: Normal range of motion.  Neurological: She is alert and oriented to person, place, and time.  Skin: Skin is warm and dry. Rash noted. She is not diaphoretic. There is erythema.  Psychiatric: She has a normal mood and affect. Her behavior is normal. Judgment and thought  content normal.  Nursing note and vitals reviewed.   Urgent Care Course   Clinical Course    .Marland Kitchen.Incision and Drainage Date/Time: 12/16/2015 10:13 AM Performed by: Lutricia FeilOEMER, Tillman Kazmierski P Authorized by: Hassan RowanWADE, EUGENE   Consent:    Consent obtained:  Verbal   Consent given by:  Patient   Risks discussed:  Incomplete drainage and infection   Alternatives discussed:  Alternative treatment Location:    Type:  Abscess   Size:  6x8   Location:  Trunk   Trunk  location:  Back Pre-procedure details:    Skin preparation:  Betadine Anesthesia (see MAR for exact dosages):    Anesthesia method:  Local infiltration   Local anesthetic:  Lidocaine 1% w/o epi Procedure type:    Complexity:  Complex Procedure details:    Needle aspiration: no     Incision types:  Single straight   Incision depth:  Fascia   Scalpel blade:  11   Wound management:  Probed and deloculated   Drainage:  Bloody and purulent   Drainage amount:  Moderate   Wound treatment:  Drain placed and wound left open   Packing materials:  1/4 in gauze   Amount 1/4":  3 inches Post-procedure details:    Patient tolerance of procedure:  Tolerated well, no immediate complications Comments:     Encourage patient to keep dry for 48 hours. Is covered with Tegaderm to assist her with showering. Is given extra Tegaderm if the wound became overly so from drainage and she will change it out at that time. She'll return in 48 hours for reevaluation. Introduced her to Dr. Thurmond ButtsWade who will assume her care from here on. Cultures were obtained and sent to the lab for cultures and sensitivities. Continue her Septra DS for another 3 days for a total of 10 and she continues to take her clindamycin until results of sensitivities are available.    (including critical care time)  Labs Review Labs Reviewed  AEROBIC/ANAEROBIC CULTURE (SURGICAL/DEEP WOUND)    Imaging Review No results found.   Visual Acuity Review  Right Eye Distance:   Left Eye Distance:   Bilateral Distance:    Right Eye Near:   Left Eye Near:    Bilateral Near:         MDM   1. Abscess and cellulitis    Current Discharge Medication List    Plan: 1. Test/x-ray results and diagnosis reviewed with patient 2. rx as per orders; risks, benefits, potential side effects reviewed with patient 3. Recommend supportive treatment with Keeping area dry for48 hours. She was given extra Tegaderm for dressing changes if it does become  overly soiled. She will return in 48 hours for follow-up. Additional Septra was provided to her for a total of 10 days therapy along with the clindamycin until sensitivities are available 4. F/u prn if symptoms worsen or don't improve     Lutricia FeilWilliam P Kin Galbraith, PA-C 12/16/15 1022

## 2015-12-16 NOTE — ED Triage Notes (Signed)
Recheck of abcess on back.

## 2015-12-18 ENCOUNTER — Ambulatory Visit
Admission: EM | Admit: 2015-12-18 | Discharge: 2015-12-18 | Disposition: A | Discharge status: Home / Self Care | Payer: 59 | Attending: Family Medicine | Admitting: Family Medicine

## 2015-12-18 ENCOUNTER — Encounter: Payer: Self-pay | Admitting: Gynecology

## 2015-12-18 DIAGNOSIS — IMO0001 Reserved for inherently not codable concepts without codable children: Secondary | ICD-10-CM

## 2015-12-18 DIAGNOSIS — T814XXD Infection following a procedure, subsequent encounter: Secondary | ICD-10-CM

## 2015-12-18 HISTORY — DX: Cutaneous abscess, unspecified: L02.91

## 2015-12-18 NOTE — ED Triage Notes (Signed)
Patient is here for recheck of abscess and cellulitis of mid back region.

## 2015-12-18 NOTE — ED Provider Notes (Signed)
[facility]    CSN: [id number] Arrival date & time: 12/18/15  [id number]  First Provider Contact:  First MD Initiated Contact with Patient 12/18/15 [phone]        History   Chief Complaint Chief Complaint  Patient presents with  . Cellulitis    HPI Nicole Vargas is a 45 y.o. female.   Patient's here for a follow-up of abscess opening. She reports some site is much improved pain is much better she is doing much better since she's opening. She's been able to go to work as well. She reports the drainage is as well.   The history is provided by the patient. No language interpreter was used.  Wound Check  This is a recurrent problem. The current episode started more than 2 days ago. The problem has been rapidly improving. Pertinent negatives include no chest pain, no abdominal pain, no headaches and no shortness of breath. Nothing aggravates the symptoms. She has tried nothing for the symptoms. The treatment provided significant relief.    Past Medical History:  Diagnosis Date  . Abscess   . Hypertension     Patient Active Problem List   Diagnosis Date Noted  . Iron deficiency 07/05/2015  . Vitamin D deficiency 11/24/2014  . Prediabetes 11/24/2014  . Essential (primary) hypertension 08/06/2014  . Obesity, Class III, BMI 40-49.9 (morbid obesity) (HCC) 08/06/2014    Past Surgical History:  Procedure Laterality Date  . CESAREAN SECTION  03/04/2000  . CHOLECYSTECTOMY  2006  . TUBAL LIGATION  2006    OB History    No data available       Home Medications    Prior to Admission medications   Medication Sig Start Date End Date Taking? Authorizing Provider  Cholecalciferol (VITAMIN D) 2000 units CAPS Take 1 capsule (2,000 Units total) by mouth daily. 07/05/15  Yes Schuyler AmorWilliam Plonk, MD  clindamycin (CLEOCIN) 300 MG capsule Take 1 capsule (300 mg total) by mouth 3 (three) times daily. 12/14/15  Yes Lutricia FeilWilliam P Roemer, PA-C  ferrous sulfate 325 (65 FE) MG EC  tablet Take 1 tablet (325 mg total) by mouth daily with breakfast. 07/05/15  Yes Schuyler AmorWilliam Plonk, MD  lisinopril-hydrochlorothiazide (PRINZIDE,ZESTORETIC) 20-25 MG tablet Take 1 tablet by mouth daily. 10/12/15  Yes Schuyler AmorWilliam Plonk, MD  mupirocin ointment (BACTROBAN) 2 % Apply 1 application topically 3 (three) times daily. 12/14/15  Yes Lutricia FeilWilliam P Roemer, PA-C  sulfamethoxazole-trimethoprim (BACTRIM DS,SEPTRA DS) 800-160 MG tablet Take 1 tablet by mouth 2 (two) times daily. 12/16/15 12/19/15 Yes Lutricia FeilWilliam P Roemer, PA-C    Family History Family History  Problem Relation Age of Onset  . Diabetes Maternal Grandmother   . Heart disease Paternal Grandfather     Social History Social History  Substance Use Topics  . Smoking status: Never Smoker  . Smokeless tobacco: Never Used  . Alcohol use No     Allergies   Review of patient's allergies indicates no known allergies.   Review of Systems Review of Systems  Respiratory: Negative for shortness of breath.   Cardiovascular: Negative for chest pain.  Gastrointestinal: Negative for abdominal pain.  Neurological: Negative for headaches.  All other systems reviewed and are negative.    Physical Exam Triage Vital Signs ED Triage Vitals  Enc Vitals Group     BP 12/18/15 0839 121/73     Pulse Rate 12/18/15 0839 93     Resp 12/18/15 0839 16     Temp 12/18/15 0839 97.8 F (36.6 C)  Temp Source 12/18/15 0839 Oral     SpO2 12/18/15 0839 99 %     Weight 12/18/15 0842 230 lb (104.3 kg)     Height 12/18/15 0842 4\' 11"  (1.499 m)     Head Circumference --      Peak Flow --      Pain Score --      Pain Loc --      Pain Edu? --      Excl. in GC? --    No data found.   Updated Vital Signs BP 121/73 (BP Location: Left Arm)   Pulse 93   Temp 97.8 F (36.6 C) (Oral)   Resp 16   Ht 4\' 11"  (1.499 m)   Wt 230 lb (104.3 kg)   LMP 11/28/2015   SpO2 99%   BMI 46.45 kg/m   Visual Acuity Right Eye Distance:   Left Eye Distance:   Bilateral  Distance:    Right Eye Near:   Left Eye Near:    Bilateral Near:     Physical Exam  Constitutional: She is oriented to person, place, and time. She appears well-developed and well-nourished.  HENT:  Head: Normocephalic and atraumatic.  Eyes: Pupils are equal, round, and reactive to light.  Neck: Normal range of motion.  Pulmonary/Chest: Effort normal.  Musculoskeletal: Normal range of motion.  Neurological: She is alert and oriented to person, place, and time.  Skin: There is erythema.     Abscesses draining from the wound over the left back under the scapula. The wound though is very small since a lot of the swelling has reduced the size of the opening.  Psychiatric: She has a normal mood and affect.  Vitals reviewed.    UC Treatments / Results  Labs (all labs ordered are listed, but only abnormal results are displayed) Labs Reviewed - No data to display  EKG  EKG Interpretation None       Radiology No results found.  Procedures Wound packing Date/Time: 12/18/2015 9:34 AM Performed by: Hassan Rowan Authorized by: Hassan Rowan  Consent: Verbal consent obtained. Risks and benefits: risks, benefits and alternatives were discussed Local anesthesia used: no  Anesthesia: Local anesthesia used: no  Sedation: Patient sedated: no Patient tolerance: Patient tolerated the procedure well with no immediate complications Comments: Wound was repacked with sterile gauze. Patient tolerated procedure well follow-up in 2 days to 3 days to have that packing removed.    (including critical care time)  Medications Ordered in UC Medications - No data to display   Initial Impression / Assessment and Plan / UC Course  I have reviewed the triage vital signs and the nursing notes.  Pertinent labs & imaging results that were available during my care of the patient were reviewed by me and considered in my medical decision making (see chart for details).  Clinical Course     Patient tolerated the repacking follow-up with Mr Phillis Knack on Tuesday.  Final Clinical Impressions(s) / UC Diagnoses   Final diagnoses:  Wound abscess, subsequent encounter    New Prescriptions New Prescriptions   No medications on file     Note: This dictation was prepared with Dragon dictation along with smaller phrase technology. Any transcriptional errors that result from this process are unintentional.   Hassan Rowan, MD 12/18/15 [phone]

## 2015-12-21 ENCOUNTER — Ambulatory Visit
Admission: EM | Admit: 2015-12-21 | Discharge: 2015-12-21 | Disposition: A | Discharge status: Home / Self Care | Payer: 59

## 2015-12-21 ENCOUNTER — Encounter: Payer: Self-pay | Admitting: *Deleted

## 2015-12-21 DIAGNOSIS — Z09 Encounter for follow-up examination after completed treatment for conditions other than malignant neoplasm: Secondary | ICD-10-CM

## 2015-12-21 DIAGNOSIS — Z4801 Encounter for change or removal of surgical wound dressing: Secondary | ICD-10-CM

## 2015-12-21 LAB — AEROBIC/ANAEROBIC CULTURE W GRAM STAIN (SURGICAL/DEEP WOUND): Culture: NORMAL

## 2015-12-21 LAB — AEROBIC/ANAEROBIC CULTURE (SURGICAL/DEEP WOUND): SPECIAL REQUESTS: NORMAL

## 2015-12-21 NOTE — ED Triage Notes (Signed)
Pt here for wound recheck

## 2015-12-21 NOTE — ED Provider Notes (Signed)
CSN: [id number]     Arrival date & time 12/21/15  [id number] History   None    Chief Complaint  Patient presents with  . Wound Check   (Consider location/radiation/quality/duration/timing/severity/associated sxs/prior Treatment) HPI  Patient returns for wound check following repacking of the abscess in her left upper posterior thorax by Dr. Thurmond ButtsWade 2 days ago. She is not having change the dressings but there is obvious some drainage on the dressing prior to removal. She's had no fever or chills. She has finished her Septra but still has 1 more day left of the clindamycin.     Past Medical History:  Diagnosis Date  . Abscess   . Hypertension    Past Surgical History:  Procedure Laterality Date  . CESAREAN SECTION  03/04/2000  . CHOLECYSTECTOMY  2006  . TUBAL LIGATION  2006   Family History  Problem Relation Age of Onset  . Diabetes Maternal Grandmother   . Heart disease Paternal Grandfather    Social History  Substance Use Topics  . Smoking status: Never Smoker  . Smokeless tobacco: Never Used  . Alcohol use No   OB History    No data available     Review of Systems  Constitutional: Negative for activity change, appetite change, chills, fatigue and fever.  Skin: Positive for wound.  All other systems reviewed and are negative.   Allergies  Review of patient's allergies indicates no known allergies.  Home Medications   Prior to Admission medications   Medication Sig Start Date End Date Taking? Authorizing Provider  Cholecalciferol (VITAMIN D) 2000 units CAPS Take 1 capsule (2,000 Units total) by mouth daily. 07/05/15   Schuyler AmorWilliam Plonk, MD  clindamycin (CLEOCIN) 300 MG capsule Take 1 capsule (300 mg total) by mouth 3 (three) times daily. 12/14/15   Lutricia FeilWilliam P Maveric Debono, PA-C  ferrous sulfate 325 (65 FE) MG EC tablet Take 1 tablet (325 mg total) by mouth daily with breakfast. 07/05/15   Schuyler AmorWilliam Plonk, MD  lisinopril-hydrochlorothiazide (PRINZIDE,ZESTORETIC) 20-25 MG tablet Take 1  tablet by mouth daily. 10/12/15   Schuyler AmorWilliam Plonk, MD  mupirocin ointment (BACTROBAN) 2 % Apply 1 application topically 3 (three) times daily. 12/14/15   Lutricia FeilWilliam P Anginette Espejo, PA-C   Meds Ordered and Administered this Visit  Medications - No data to display  BP 124/74 (BP Location: Left Arm)   Pulse 84   Temp 98.5 F (36.9 C) (Oral)   Resp 16   Ht 4\' 11"  (1.499 m)   Wt 230 lb (104.3 kg)   LMP 11/28/2015   SpO2 98%   BMI 46.45 kg/m  No data found.   Physical Exam  Constitutional: She is oriented to person, place, and time. She appears well-developed and well-nourished. No distress.  HENT:  Head: Normocephalic and atraumatic.  Eyes: EOM are normal. Pupils are equal, round, and reactive to light.  Neck: Normal range of motion. Neck supple.  Musculoskeletal: Normal range of motion.  Neurological: She is alert and oriented to person, place, and time.  Skin: Skin is warm and dry. She is not diaphoretic.  Examination of the abscess shows mild induration over the superior aspect of the abscess. It is markedly improved with the cellulitis. The drain is in good position. This was removed with the expression of several drops of purulence.  Psychiatric: She has a normal mood and affect. Her behavior is normal. Judgment and thought content normal.  Nursing note and vitals reviewed.   Urgent Care Course   Clinical Course  Procedures (including critical care time)  Labs Review Labs Reviewed - No data to display  Imaging Review No results found.   Visual Acuity Review  Right Eye Distance:   Left Eye Distance:   Bilateral Distance:    Right Eye Near:   Left Eye Near:    Bilateral Near:     After removal of the drain some purulence was expressed from the wound. There still remains induration over the superior aspect. The wound has large amount of open space and I decided to repack the wound with iodoform gauze loosely to allow drainage without closing of the wound prematurely. The  patient tolerated the procedure well without any anesthesia. A dry sterile dressing was applied to the surface. She will change the dressing  if it becomes overly soiled. I will plan on seeing her back in 2 days for another wound check hopefully to discontinue the drain and begin washing program.    MDM   1. Encounter for recheck of abscess following incision and drainage    Patient was fully corrected in care for the wound in the interim. She will finish her current prescriptions. Return in 2 days for a follow-up and discontinuation of the drain hopefully to begin washing program at that time.    Lutricia Feil, PA-C 12/21/15 [phone]

## 2015-12-23 ENCOUNTER — Ambulatory Visit
Admission: EM | Admit: 2015-12-23 | Discharge: 2015-12-23 | Disposition: A | Discharge status: Home / Self Care | Payer: 59 | Attending: Family Medicine | Admitting: Family Medicine

## 2015-12-23 DIAGNOSIS — L02219 Cutaneous abscess of trunk, unspecified: Secondary | ICD-10-CM

## 2015-12-23 DIAGNOSIS — L03319 Cellulitis of trunk, unspecified: Secondary | ICD-10-CM

## 2015-12-23 NOTE — ED Provider Notes (Signed)
CSN: [id number]     Arrival date & time 12/23/15  4403 History   None    Chief Complaint  Patient presents with  . Follow-up    Boil   (Consider location/radiation/quality/duration/timing/severity/associated sxs/prior Treatment) HPI   Patient returns today stating that she has not been doing anything with the dressing has was told at her last visit. At that time a drain was placed loosely into the wound to continue to rule out drain and not close completely. She does have a reaction to the paper tape that was placed on her skin but no actual skin breakdown is seen.  Past Medical History:  Diagnosis Date  . Abscess   . Hypertension    Past Surgical History:  Procedure Laterality Date  . CESAREAN SECTION  03/04/2000  . CHOLECYSTECTOMY  2006  . TUBAL LIGATION  2006   Family History  Problem Relation Age of Onset  . Diabetes Maternal Grandmother   . Heart disease Paternal Grandfather    Social History  Substance Use Topics  . Smoking status: Never Smoker  . Smokeless tobacco: Never Used  . Alcohol use No   OB History    No data available     Review of Systems  Constitutional: Negative for activity change, appetite change, chills, fatigue and fever.  Skin: Positive for wound.  All other systems reviewed and are negative.   Allergies  Review of patient's allergies indicates no known allergies.  Home Medications   Prior to Admission medications   Medication Sig Start Date End Date Taking? Authorizing Provider  Cholecalciferol (VITAMIN D) 2000 units CAPS Take 1 capsule (2,000 Units total) by mouth daily. 07/05/15  Yes Schuyler Amor, MD  ferrous sulfate 325 (65 FE) MG EC tablet Take 1 tablet (325 mg total) by mouth daily with breakfast. 07/05/15  Yes Schuyler Amor, MD  lisinopril-hydrochlorothiazide (PRINZIDE,ZESTORETIC) 20-25 MG tablet Take 1 tablet by mouth daily. 10/12/15  Yes Schuyler Amor, MD  mupirocin ointment (BACTROBAN) 2 % Apply 1 application topically 3 (three)  times daily. 12/14/15  Yes Lutricia Feil, PA-C  clindamycin (CLEOCIN) 300 MG capsule Take 1 capsule (300 mg total) by mouth 3 (three) times daily. 12/14/15   Lutricia Feil, PA-C   Meds Ordered and Administered this Visit  Medications - No data to display  BP 108/64 (BP Location: Left Arm)   Pulse 89   Temp 98.2 F (36.8 C) (Oral)   Resp 18   Ht 4\' 11"  (1.499 m)   Wt 230 lb (104.3 kg)   LMP 11/28/2015   SpO2 99%   BMI 46.45 kg/m  No data found.   Physical Exam  Constitutional: She is oriented to person, place, and time. She appears well-developed and well-nourished. No distress.  HENT:  Head: Normocephalic and atraumatic.  Eyes: EOM are normal. Pupils are equal, round, and reactive to light.  Neck: Normal range of motion. Neck supple.  Musculoskeletal: Normal range of motion.  Neurological: She is alert and oriented to person, place, and time.  Skin: Skin is warm and dry. She is not diaphoretic.  Examination of the wound over her posterior right thorax is a drain in place. This was removed after prepping of the skin with Hibiclens solution. A small amount of purulence expressed from the wound. Induration is markedly improved. Cellulitis is gone. The skin surrounding the wound is irritated but this is superficial and apparently from the adhesive in the paper tape.  Nursing note and vitals reviewed.   Urgent  Care Course   Clinical Course    Procedures (including critical care time)  Labs Review Labs Reviewed - No data to display  Imaging Review No results found.   Visual Acuity Review  Right Eye Distance:   Left Eye Distance:   Bilateral Distance:    Right Eye Near:   Left Eye Near:    Bilateral Near:         MDM   1. Cellulitis and abscess of trunk    I have discussed with the patient the healing of the abscess and cellulitis. Cellulitis is most completely healed the abscess is still has a slight amount of purulent drainage but remains open. Probing  of the wound shows it to still have a cavity present but has been reduced in size significantly. At this time it appears that we can discontinue the repacking and start her on a washing warm compresses program followed by Bactroban 3 times daily. I will see her her back in 2 days for 1 final visit to assess for developing cellulitis or induration. She is now off of all oral antibiotics.    Lutricia FeilWilliam P [place]unna Coco, PA-C 12/23/15 1030

## 2015-12-23 NOTE — ED Triage Notes (Signed)
Patient is here for a follow up on her boil.

## 2015-12-25 ENCOUNTER — Ambulatory Visit
Admission: EM | Admit: 2015-12-25 | Discharge: 2015-12-25 | Disposition: A | Discharge status: Home / Self Care | Payer: 59 | Attending: Family Medicine | Admitting: Family Medicine

## 2015-12-25 DIAGNOSIS — L02212 Cutaneous abscess of back [any part, except buttock]: Secondary | ICD-10-CM

## 2015-12-25 NOTE — ED Provider Notes (Signed)
[facility]    CSN: [id number] Arrival date & time: 12/25/15  1053  First Provider Contact:  None       History   Chief Complaint Chief Complaint  Patient presents with  . Recurrent Skin Infections    Recheck    HPI Nicole Vargas is a 45 y.o. female.   The history is provided by the patient.  Patient states that she is just here for a recheck of her boil. Patient states that area has improved a lot since packing was taken out on Thursday. Patient denies any drainage from site today. States feels fine.   Past Medical History:  Diagnosis Date  . Abscess   . Hypertension     Patient Active Problem List   Diagnosis Date Noted  . Iron deficiency 07/05/2015  . Vitamin D deficiency 11/24/2014  . Prediabetes 11/24/2014  . Essential (primary) hypertension 08/06/2014  . Obesity, Class III, BMI 40-49.9 (morbid obesity) (HCC) 08/06/2014    Past Surgical History:  Procedure Laterality Date  . CESAREAN SECTION  03/04/2000  . CHOLECYSTECTOMY  2006  . TUBAL LIGATION  2006    OB History    No data available       Home Medications    Prior to Admission medications   Medication Sig Start Date End Date Taking? Authorizing Provider  Cholecalciferol (VITAMIN D) 2000 units CAPS Take 1 capsule (2,000 Units total) by mouth daily. 07/05/15  Yes Schuyler AmorWilliam Plonk, MD  ferrous sulfate 325 (65 FE) MG EC tablet Take 1 tablet (325 mg total) by mouth daily with breakfast. 07/05/15  Yes Schuyler AmorWilliam Plonk, MD  lisinopril-hydrochlorothiazide (PRINZIDE,ZESTORETIC) 20-25 MG tablet Take 1 tablet by mouth daily. 10/12/15  Yes Schuyler AmorWilliam Plonk, MD  mupirocin ointment (BACTROBAN) 2 % Apply 1 application topically 3 (three) times daily. 12/14/15  Yes Lutricia FeilWilliam P Roemer, PA-C  clindamycin (CLEOCIN) 300 MG capsule Take 1 capsule (300 mg total) by mouth 3 (three) times daily. 12/14/15   Lutricia FeilWilliam P Roemer, PA-C    Family History Family History  Problem Relation Age of Onset  . Diabetes  Maternal Grandmother   . Heart disease Paternal Grandfather     Social History Social History  Substance Use Topics  . Smoking status: Never Smoker  . Smokeless tobacco: Never Used  . Alcohol use No     Allergies   Review of patient's allergies indicates no known allergies.   Review of Systems Review of Systems   Physical Exam Triage Vital Signs ED Triage Vitals  Enc Vitals Group     BP 12/25/15 1112 113/81     Pulse Rate 12/25/15 1112 84     Resp 12/25/15 1112 16     Temp 12/25/15 1112 97.7 F (36.5 C)     Temp Source 12/25/15 1112 Oral     SpO2 12/25/15 1112 98 %     Weight 12/25/15 1111 230 lb (104.3 kg)     Height 12/25/15 1111 4' 11.02" (1.499 m)     Head Circumference --      Peak Flow --      Pain Score 12/25/15 1113 0     Pain Loc --      Pain Edu? --      Excl. in GC? --    No data found.   Updated Vital Signs BP 113/81 (BP Location: Left Arm)   Pulse 84   Temp 97.7 F (36.5 C) (Oral)   Resp 16   Ht 4' 11.02" (1.499 m)  Wt 230 lb (104.3 kg)   LMP 11/28/2015   SpO2 98%   BMI 46.43 kg/m   Visual Acuity Right Eye Distance:   Left Eye Distance:   Bilateral Distance:    Right Eye Near:   Left Eye Near:    Bilateral Near:     Physical Exam  Constitutional: She appears well-developed and well-nourished. No distress.  Skin: She is not diaphoretic.  Healed abscess site. No erythema or tenderness  Nursing note and vitals reviewed.    UC Treatments / Results  Labs (all labs ordered are listed, but only abnormal results are displayed) Labs Reviewed - No data to display  EKG  EKG Interpretation None       Radiology No results found.  Procedures Procedures (including critical care time)  Medications Ordered in UC Medications - No data to display   Initial Impression / Assessment and Plan / UC Course  I have reviewed the triage vital signs and the nursing notes.  Pertinent labs & imaging results that were available during  my care of the patient were reviewed by me and considered in my medical decision making (see chart for details).  Clinical Course      Final Clinical Impressions(s) / UC Diagnoses   Final diagnoses:  Back abscess    New Prescriptions Discharge Medication List as of 12/25/2015 11:35 AM      resolved F/u prn   Payton Mccallum, MD 12/25/15 1141

## 2015-12-25 NOTE — ED Triage Notes (Signed)
Patient states that she is just here for a recheck of her boil. Patient states that area has improved a lot since packing was taken out on Thursday. Patient denies any drainage from site today.

## 2016-04-13 ENCOUNTER — Ambulatory Visit: Payer: Self-pay | Admitting: Family Medicine

## 2016-04-14 ENCOUNTER — Ambulatory Visit (INDEPENDENT_AMBULATORY_CARE_PROVIDER_SITE_OTHER): Payer: 59 | Admitting: Internal Medicine

## 2016-04-14 ENCOUNTER — Encounter: Payer: Self-pay | Admitting: Internal Medicine

## 2016-04-14 VITALS — BP 118/86 | HR 99 | Temp 97.6°F | Ht 59.0 in | Wt 232.0 lb

## 2016-04-14 DIAGNOSIS — R7303 Prediabetes: Secondary | ICD-10-CM

## 2016-04-14 DIAGNOSIS — M722 Plantar fascial fibromatosis: Secondary | ICD-10-CM | POA: Insufficient documentation

## 2016-04-14 DIAGNOSIS — I10 Essential (primary) hypertension: Secondary | ICD-10-CM

## 2016-04-14 HISTORY — DX: Plantar fascial fibromatosis: M72.2

## 2016-04-14 NOTE — Progress Notes (Signed)
Date:  04/14/2016   Name:  Nicole Vargas   DOB:  12/19/1970   MRN:  [id number]   Chief Complaint: Hypertension Hypertension  This is a chronic problem. The current episode started more than 1 year ago. The problem is unchanged. The problem is controlled. Associated symptoms include shortness of breath. Pertinent negatives include no chest pain, headaches or palpitations. There are no associated agents to hypertension. Past treatments include ACE inhibitors and diuretics. The current treatment provides significant improvement.    Lab Results  Component Value Date   CREATININE 0.68 07/05/2015   Lab Results  Component Value Date   WBC 9.0 07/05/2015   HCT 39.0 07/05/2015   MCV 86 07/05/2015   PLT 290 07/05/2015   Lab Results  Component Value Date   HGBA1C 5.5 10/12/2015     Review of Systems  Constitutional: Negative for fatigue and fever.  HENT: Negative for hearing loss.   Eyes: Negative for visual disturbance.  Respiratory: Positive for shortness of breath. Negative for cough, chest tightness and wheezing.   Cardiovascular: Negative for chest pain, palpitations and leg swelling.  Gastrointestinal: Positive for diarrhea. Negative for abdominal pain and blood in stool.  Musculoskeletal: Positive for arthralgias (pain in heels after sitting).  Skin: Negative for rash.  Neurological: Positive for light-headedness. Negative for dizziness and headaches.  Psychiatric/Behavioral: Negative for dysphoric mood and sleep disturbance.    Patient Active Problem List   Diagnosis Date Noted  . Iron deficiency 07/05/2015  . Vitamin D deficiency 11/24/2014  . Prediabetes 11/24/2014  . Essential (primary) hypertension 08/06/2014  . Obesity, Class III, BMI 40-49.9 (morbid obesity) (HCC) 08/06/2014    Prior to Admission medications   Medication Sig Start Date End Date Taking? Authorizing Provider  Cholecalciferol (VITAMIN D) 2000 units CAPS Take 1 capsule (2,000 Units  total) by mouth daily. 07/05/15  Yes Schuyler AmorWilliam Plonk, MD  clindamycin (CLEOCIN) 300 MG capsule Take 1 capsule (300 mg total) by mouth 3 (three) times daily. 12/14/15  Yes Lutricia FeilWilliam P Roemer, PA-C  ferrous sulfate 325 (65 FE) MG EC tablet Take 1 tablet (325 mg total) by mouth daily with breakfast. 07/05/15  Yes Schuyler AmorWilliam Plonk, MD  lisinopril-hydrochlorothiazide (PRINZIDE,ZESTORETIC) 20-25 MG tablet Take 1 tablet by mouth daily. 10/12/15  Yes Schuyler AmorWilliam Plonk, MD  mupirocin ointment (BACTROBAN) 2 % Apply 1 application topically 3 (three) times daily. Patient not taking: Reported on 04/14/2016 12/14/15   Lutricia FeilWilliam P Roemer, PA-C    No Known Allergies  Past Surgical History:  Procedure Laterality Date  . CESAREAN SECTION  03/04/2000  . CHOLECYSTECTOMY  2006  . TUBAL LIGATION  2006    Social History  Substance Use Topics  . Smoking status: Never Smoker  . Smokeless tobacco: Never Used  . Alcohol use No     Medication list has been reviewed and updated.   Physical Exam  Constitutional: She is oriented to person, place, and time. She appears well-developed. No distress.  HENT:  Head: Normocephalic and atraumatic.  Neck: Normal range of motion. Neck supple.  Cardiovascular: Normal rate, regular rhythm and normal heart sounds.   Pulmonary/Chest: Effort normal and breath sounds normal. No respiratory distress. She has no wheezes.  Musculoskeletal: Normal range of motion. She exhibits edema (trace). She exhibits no tenderness.  Neurological: She is alert and oriented to person, place, and time.  Skin: Skin is warm and dry. No rash noted.  Psychiatric: She has a normal mood and affect. Her behavior is normal. Thought content  normal.  Nursing note and vitals reviewed.   BP 118/86   Pulse 99   Temp 97.6 F (36.4 C)   Ht 4\' 11"  (1.499 m)   Wt 232 lb (105.2 kg)   SpO2 99%   BMI 46.86 kg/m   Assessment and Plan: 1. Essential (primary) hypertension Controlled on medication  2.  Prediabetes Continue diet - measures discussed  3. Plantar fasciitis, bilateral Ice massage after work as needed  Patient urged to schedule CPX for Pap and Mammogram.  Bari EdwardLaura Alynn Ellithorpe, MD [facility]  04/14/2016

## 2016-04-14 NOTE — Patient Instructions (Signed)
Plantar Fasciitis Plantar fasciitis is a painful foot condition that affects the heel. It occurs when the band of tissue that connects the toes to the heel bone (plantar fascia) becomes irritated. This can happen after exercising too much or doing other repetitive activities (overuse injury). The pain from plantar fasciitis can range from mild irritation to severe pain that makes it difficult for you to walk or move. The pain is usually worse in the morning or after you have been sitting or lying down for a while. CAUSES This condition may be caused by:  Standing for long periods of time.  Wearing shoes that do not fit.  Doing high-impact activities, including running, aerobics, and ballet.  Being overweight.  Having an abnormal way of walking (gait).  Having tight calf muscles.  Having high arches in your feet.  Starting a new athletic activity. SYMPTOMS The main symptom of this condition is heel pain. Other symptoms include:  Pain that gets worse after activity or exercise.  Pain that is worse in the morning or after resting.  Pain that goes away after you walk for a few minutes. DIAGNOSIS This condition may be diagnosed based on your signs and symptoms. Your health care provider will also do a physical exam to check for:  A tender area on the bottom of your foot.  A high arch in your foot.  Pain when you move your foot.  Difficulty moving your foot. You may also need to have imaging studies to confirm the diagnosis. These can include:  X-rays.  Ultrasound.  MRI. TREATMENT  Treatment for plantar fasciitis depends on the severity of the condition. Your treatment may include:  Rest, ice, and over-the-counter pain medicines to manage your pain.  Exercises to stretch your calves and your plantar fascia.  A splint that holds your foot in a stretched, upward position while you sleep (night splint).  Physical therapy to relieve symptoms and prevent problems in the  future.  Cortisone injections to relieve severe pain.  Extracorporeal shock wave therapy (ESWT) to stimulate damaged plantar fascia with electrical impulses. It is often used as a last resort before surgery.  Surgery, if other treatments have not worked after 12 months. HOME CARE INSTRUCTIONS  Take medicines only as directed by your health care provider.  Avoid activities that cause pain.  Roll the bottom of your foot over a bag of ice or a bottle of cold water. Do this for 20 minutes, 3-4 times a day.  Perform simple stretches as directed by your health care provider.  Try wearing athletic shoes with air-sole or gel-sole cushions or soft shoe inserts.  Wear a night splint while sleeping, if directed by your health care provider.  Keep all follow-up appointments with your health care provider. PREVENTION   Do not perform exercises or activities that cause heel pain.  Consider finding low-impact activities if you continue to have problems.  Lose weight if you need to. The best way to prevent plantar fasciitis is to avoid the activities that aggravate your plantar fascia. SEEK MEDICAL CARE IF:  Your symptoms do not go away after treatment with home care measures.  Your pain gets worse.  Your pain affects your ability to move or do your daily activities. This information is not intended to replace advice given to you by your health care provider. Make sure you discuss any questions you have with your health care provider. Document Released: 01/03/2001 Document Revised: 08/02/2015 Document Reviewed: 02/18/2014 Elsevier Interactive Patient Education    2017 [employer] Inc[employer]  

## 2016-05-22 DIAGNOSIS — Z23 Encounter for immunization: Secondary | ICD-10-CM | POA: Diagnosis not present

## 2016-05-28 ENCOUNTER — Emergency Department
Admission: EM | Admit: 2016-05-28 | Discharge: 2016-05-29 | Disposition: A | Discharge status: Home / Self Care | Payer: 59 | Attending: Emergency Medicine | Admitting: Emergency Medicine

## 2016-05-28 DIAGNOSIS — R41840 Attention and concentration deficit: Secondary | ICD-10-CM | POA: Diagnosis not present

## 2016-05-28 DIAGNOSIS — Z79899 Other long term (current) drug therapy: Secondary | ICD-10-CM | POA: Insufficient documentation

## 2016-05-28 DIAGNOSIS — R4182 Altered mental status, unspecified: Secondary | ICD-10-CM | POA: Diagnosis present

## 2016-05-28 DIAGNOSIS — I10 Essential (primary) hypertension: Secondary | ICD-10-CM | POA: Insufficient documentation

## 2016-05-28 LAB — CBC
HCT: 42.7 % (ref 35.0–47.0)
Hemoglobin: 14.2 g/dL (ref 12.0–16.0)
MCH: 29.9 pg (ref 26.0–34.0)
MCHC: 33.2 g/dL (ref 32.0–36.0)
MCV: 90.1 fL (ref 80.0–100.0)
Platelets: 289 10*3/uL (ref 150–440)
RBC: 4.74 MIL/uL (ref 3.80–5.20)
RDW: 13.2 % (ref 11.5–14.5)
WBC: 9.8 10*3/uL (ref 3.6–11.0)

## 2016-05-28 LAB — GLUCOSE, CAPILLARY: Glucose-Capillary: 123 mg/dL — ABNORMAL HIGH (ref 65–99)

## 2016-05-28 LAB — COMPREHENSIVE METABOLIC PANEL
ALT: 17 U/L (ref 14–54)
AST: 23 U/L (ref 15–41)
Albumin: 4.1 g/dL (ref 3.5–5.0)
Alkaline Phosphatase: 30 U/L — ABNORMAL LOW (ref 38–126)
Anion gap: 9 (ref 5–15)
BILIRUBIN TOTAL: 0.6 mg/dL (ref 0.3–1.2)
BUN: 14 mg/dL (ref 6–20)
CHLORIDE: 104 mmol/L (ref 101–111)
CO2: 24 mmol/L (ref 22–32)
CREATININE: 0.68 mg/dL (ref 0.44–1.00)
Calcium: 9.3 mg/dL (ref 8.9–10.3)
Glucose, Bld: 119 mg/dL — ABNORMAL HIGH (ref 65–99)
POTASSIUM: 3.9 mmol/L (ref 3.5–5.1)
Sodium: 137 mmol/L (ref 135–145)
TOTAL PROTEIN: 7.7 g/dL (ref 6.5–8.1)

## 2016-05-28 LAB — URINALYSIS, COMPLETE (UACMP) WITH MICROSCOPIC
Bacteria, UA: NONE SEEN
Bilirubin Urine: NEGATIVE
GLUCOSE, UA: NEGATIVE mg/dL
Hgb urine dipstick: NEGATIVE
KETONES UR: 5 mg/dL — AB
Leukocytes, UA: NEGATIVE
Nitrite: NEGATIVE
PH: 5 (ref 5.0–8.0)
Protein, ur: NEGATIVE mg/dL
SPECIFIC GRAVITY, URINE: 1.021 (ref 1.005–1.030)

## 2016-05-28 LAB — POCT PREGNANCY, URINE: Preg Test, Ur: NEGATIVE

## 2016-05-28 MED ORDER — SODIUM CHLORIDE 0.9 % IV BOLUS (SEPSIS): 1000.0000 mL | Freq: Once | INTRAVENOUS | Status: DC

## 2016-05-28 NOTE — ED Triage Notes (Signed)
Reports noticed  incidents of confusion.  Reports noticed couple weeks ago.  Had two episodes today first one at 11 am.

## 2016-05-28 NOTE — ED Provider Notes (Signed)
[facility] Emergency Department Provider Note  ____________________________________________  Time seen: Approximately 10:51 PM  I have reviewed the triage vital signs and the nursing notes.   HISTORY  Chief Complaint Altered Mental Status   HPI Nicole Vargas is a 46 y.o. female with a history of hypertension who presents for evaluation of difficulty concentrating. Patient reports that throughout the day today she had a hard time concentrating on any activity that she was doing. She denies being confused or disoriented. She denies headache, difficulty finding words, slurred speech, facial droop, weakness or numbness of her extremities. Patient feels markedly better at this time. No fever or chills, no recent illnesses, no vomiting or diarrhea, no chest pain or shortness of breath. Patient reports that she has had similar episodes in the past however has never seen a physician for those.  Past Medical History:  Diagnosis Date  . Abscess   . Hypertension     Patient Active Problem List   Diagnosis Date Noted  . Plantar fasciitis, bilateral 04/14/2016  . Iron deficiency 07/05/2015  . Vitamin D deficiency 11/24/2014  . Prediabetes 11/24/2014  . Essential (primary) hypertension 08/06/2014  . Obesity, Class III, BMI 40-49.9 (morbid obesity) (HCC) 08/06/2014    Past Surgical History:  Procedure Laterality Date  . CESAREAN SECTION  03/04/2000  . CHOLECYSTECTOMY  2006  . TUBAL LIGATION  2006    Prior to Admission medications   Medication Sig Start Date End Date Taking? Authorizing Provider  Cholecalciferol (VITAMIN D) 2000 units CAPS Take 1 capsule (2,000 Units total) by mouth daily. 07/05/15   Schuyler AmorWilliam Plonk, MD  ferrous sulfate 325 (65 FE) MG EC tablet Take 1 tablet (325 mg total) by mouth daily with breakfast. 07/05/15   Schuyler AmorWilliam Plonk, MD  lisinopril-hydrochlorothiazide (PRINZIDE,ZESTORETIC) 20-25 MG tablet Take 1 tablet by mouth daily.  10/12/15   Schuyler AmorWilliam Plonk, MD    Allergies Patient has no known allergies.  Family History  Problem Relation Age of Onset  . Diabetes Maternal Grandmother   . Heart disease Paternal Grandfather     Social History Social History  Substance Use Topics  . Smoking status: Never Smoker  . Smokeless tobacco: Never Used  . Alcohol use No    Review of Systems  Constitutional: Negative for fever. + difficulty concentrating Eyes: Negative for visual changes. ENT: Negative for sore throat. Neck: No neck pain  Cardiovascular: Negative for chest pain. Respiratory: Negative for shortness of breath. Gastrointestinal: Negative for abdominal pain, vomiting or diarrhea. Genitourinary: Negative for dysuria. Musculoskeletal: Negative for back pain. Skin: Negative for rash. Neurological: Negative for headaches, weakness or numbness. Psych: No SI or HI  ____________________________________________   PHYSICAL EXAM:  VITAL SIGNS: ED Triage Vitals  Enc Vitals Group     BP 05/28/16 2011 132/86     Pulse Rate 05/28/16 2011 (!) 104     Resp 05/28/16 2011 20     Temp 05/28/16 2011 97.8 F (36.6 C)     Temp Source 05/28/16 2011 Oral     SpO2 05/28/16 2011 98 %     Weight 05/28/16 2012 211 lb (95.7 kg)     Height 05/28/16 2012 4\' 11"  (1.499 m)     Head Circumference --      Peak Flow --      Pain Score --      Pain Loc --      Pain Edu? --      Excl. in GC? --  Constitutional: Alert and oriented. Well appearing and in no apparent distress. HEENT:      Head: Normocephalic and atraumatic.         Eyes: Conjunctivae are normal. Sclera is non-icteric. EOMI. PERRL      Mouth/Throat: Mucous membranes are moist.       Neck: Supple with no signs of meningismus. Cardiovascular: Regular rate and rhythm. No murmurs, gallops, or rubs. 2+ symmetrical distal pulses are present in all extremities. No JVD. Respiratory: Normal respiratory effort. Lungs are clear to auscultation bilaterally. No  wheezes, crackles, or rhonchi.  Gastrointestinal: Soft, non tender, and non distended with positive bowel sounds. No rebound or guarding. Musculoskeletal: Nontender with normal range of motion in all extremities. No edema, cyanosis, or erythema of extremities. Neurologic: Normal speech and language. A & O x3, PERRL, no nystagmus, CN II-XII intact, motor testing reveals good tone and bulk throughout. There is no evidence of pronator drift or dysmetria. Muscle strength is 5/5 throughout. Deep tendon reflexes are 2+ throughout with downgoing toes. Sensory examination is intact. Gait is normal. Skin: Skin is warm, dry and intact. No rash noted. Psychiatric: Mood and affect are normal. Speech and behavior are normal.  ____________________________________________   LABS (all labs ordered are listed, but only abnormal results are displayed)  Labs Reviewed  COMPREHENSIVE METABOLIC PANEL - Abnormal; Notable for the following:       Result Value   Glucose, Bld 119 (*)    Alkaline Phosphatase 30 (*)    All other components within normal limits  GLUCOSE, CAPILLARY - Abnormal; Notable for the following:    Glucose-Capillary 123 (*)    All other components within normal limits  CBC  URINALYSIS, COMPLETE (UACMP) WITH MICROSCOPIC  CBG MONITORING, ED   ____________________________________________  EKG  none  ____________________________________________  RADIOLOGY  none  ____________________________________________   PROCEDURES  Procedure(s) performed: None Procedures Critical Care performed:  None ____________________________________________   INITIAL IMPRESSION / ASSESSMENT AND PLAN / ED COURSE  46 y.o. female with a history of hypertension who presents for evaluation of difficulty concentrating during the day. Patient feels better. No neurological deficits. Patient never had any altered mental status or confusion she was just having a hard time concentrating. Blood work done in the  waiting room shows no acute findings. I offered to give her IV fluids as she is mildly tachycardic from not drinking much today but she refused and requested PO hydration instead. She was given water. Patient will be discharged home at this time. Recommended that she rest and follow-up with her doctor if her symptoms persist.     Pertinent labs & imaging results that were available during my care of the patient were reviewed by me and considered in my medical decision making (see chart for details).    ____________________________________________   FINAL CLINICAL IMPRESSION(S) / ED DIAGNOSES  Final diagnoses:  Poor concentration      NEW MEDICATIONS STARTED DURING THIS VISIT:  New Prescriptions   No medications on file     Note:  This document was prepared using Dragon voice recognition software and may include unintentional dictation errors.    Nita Sickle, MD 05/28/16 2300

## 2016-05-30 ENCOUNTER — Encounter: Payer: Self-pay | Admitting: Internal Medicine

## 2016-05-30 ENCOUNTER — Ambulatory Visit (INDEPENDENT_AMBULATORY_CARE_PROVIDER_SITE_OTHER): Payer: 59 | Admitting: Internal Medicine

## 2016-05-30 VITALS — BP 114/82 | HR 99 | Temp 97.6°F | Ht 59.0 in | Wt 227.0 lb

## 2016-05-30 DIAGNOSIS — Z87898 Personal history of other specified conditions: Secondary | ICD-10-CM

## 2016-05-30 DIAGNOSIS — I10 Essential (primary) hypertension: Secondary | ICD-10-CM

## 2016-05-30 NOTE — Progress Notes (Signed)
Date:  05/30/2016   Name:  Nicole Vargas   DOB:  1970-10-29   MRN:  [id number]   Chief Complaint: Hospitalization Follow-up ER visit - seen in ER for an episode of feeling like she could not focus her thoughts on one thing.  CBC, glucose and pregnancy test were all negative.  The episode lasted most of the afternoon and by the time she was seen, she felt normal.  She has has this happen 5-6 times over the past year.  This past time she had consumed a cup of coffee an hour or so before.  Of note, she eliminated caffeine from her diet to help her migraines over a year ago.  She thinks maybe the caffeine could be the cause.  She can not recall the other times if caffeine was consumed. She has hx of migraines but these episodes are not similar.  She has had one panic attack several years ago.  She denies any associated sx with the recent episode.  Review of Systems  Constitutional: Negative for chills, fatigue and fever.  HENT: Negative for congestion, postnasal drip and sinus pressure.   Eyes: Negative for visual disturbance.  Respiratory: Negative for cough, chest tightness, shortness of breath and wheezing.   Cardiovascular: Negative for chest pain, palpitations and leg swelling.  Gastrointestinal: Negative for abdominal pain, nausea and vomiting.  Neurological: Negative for dizziness, tremors, seizures, speech difficulty, weakness, light-headedness and headaches.  Psychiatric/Behavioral: Negative for dysphoric mood and sleep disturbance. The patient is not nervous/anxious.     Patient Active Problem List   Diagnosis Date Noted  . Plantar fasciitis, bilateral 04/14/2016  . Iron deficiency 07/05/2015  . Vitamin D deficiency 11/24/2014  . Prediabetes 11/24/2014  . Essential (primary) hypertension 08/06/2014  . Obesity, Class III, BMI 40-49.9 (morbid obesity) (HCC) 08/06/2014    Prior to Admission medications   Medication Sig Start Date End Date Taking? Authorizing Provider    Cholecalciferol (VITAMIN D) 2000 units CAPS Take 1 capsule (2,000 Units total) by mouth daily. 07/05/15  Yes Schuyler Amor, MD  ferrous sulfate 325 (65 FE) MG EC tablet Take 1 tablet (325 mg total) by mouth daily with breakfast. 07/05/15  Yes Schuyler Amor, MD  lisinopril-hydrochlorothiazide (PRINZIDE,ZESTORETIC) 20-25 MG tablet Take 1 tablet by mouth daily. 10/12/15  Yes Schuyler Amor, MD    No Known Allergies  Past Surgical History:  Procedure Laterality Date  . CESAREAN SECTION  03/04/2000  . CHOLECYSTECTOMY  2006  . TUBAL LIGATION  2006    Social History  Substance Use Topics  . Smoking status: Never Smoker  . Smokeless tobacco: Never Used  . Alcohol use No     Medication list has been reviewed and updated.   Physical Exam  Constitutional: She is oriented to person, place, and time. She appears well-developed and well-nourished.  Eyes: Pupils are equal, round, and reactive to light.  Neck: Normal range of motion. Neck supple. No thyromegaly present.  Cardiovascular: Normal rate, regular rhythm and normal heart sounds.   Pulmonary/Chest: Effort normal and breath sounds normal. No respiratory distress. She has no wheezes.  Musculoskeletal: Normal range of motion. She exhibits no edema.  Neurological: She is alert and oriented to person, place, and time. She has normal strength.    BP 114/82   Pulse 99   Temp 97.6 F (36.4 C)   Ht 4\' 11"  (1.499 m)   Wt 227 lb (103 kg)   LMP 05/07/2016 (Approximate)   SpO2 100%  BMI 45.85 kg/m   Assessment and Plan: 1. History of difficulty in concentrating May be due to caffeine sensitivity -recommend continued avoidance Follow up or call for Neurology referral if recurrent  2. Essential (primary) hypertension controlled   Bari EdwardLaura Nerea Bordenave, MD [facility]  05/30/2016

## 2016-06-29 ENCOUNTER — Ambulatory Visit
Admission: EM | Admit: 2016-06-29 | Discharge: 2016-06-29 | Disposition: A | Discharge status: Home / Self Care | Payer: 59 | Attending: Family Medicine | Admitting: Family Medicine

## 2016-06-29 ENCOUNTER — Encounter: Payer: Self-pay | Admitting: *Deleted

## 2016-06-29 DIAGNOSIS — R42 Dizziness and giddiness: Secondary | ICD-10-CM | POA: Diagnosis not present

## 2016-06-29 LAB — GLUCOSE, CAPILLARY: GLUCOSE-CAPILLARY: 81 mg/dL (ref 65–99)

## 2016-06-29 NOTE — ED Provider Notes (Signed)
CSN: [id number]     Arrival date & time 06/29/16  0957 History   First MD Initiated Contact with Patient 06/29/16 1138     Chief Complaint  Patient presents with  . Dizziness   (Consider location/radiation/quality/duration/timing/severity/associated sxs/prior Treatment) HPI  This is a 46 year old female who presents with a episode of lightheadedness that happened earlier this morning. She states that she did not have any dizziness there was no spinning of the room or herself. She had no nausea or vomiting. He said that when she stood up she felt  lightheaded but after she sat down, all the symptoms disappeared. After she ate breakfast this morning her symptoms have completely disappeared and she is asymptomatic at the present time. She has no history of diabetes mellitus. She states that she has been drinking water but is not as much is she should. Do not have any syncope or near-syncope.      Past Medical History:  Diagnosis Date  . Abscess   . Hypertension    Past Surgical History:  Procedure Laterality Date  . CESAREAN SECTION  03/04/2000  . CHOLECYSTECTOMY  2006  . TUBAL LIGATION  2006   Family History  Problem Relation Age of Onset  . Diabetes Maternal Grandmother   . Heart disease Paternal Grandfather    Social History  Substance Use Topics  . Smoking status: Never Smoker  . Smokeless tobacco: Never Used  . Alcohol use No   OB History    No data available     Review of Systems  Constitutional: Positive for activity change.  Neurological: Positive for light-headedness.  All other systems reviewed and are negative.   Allergies  Patient has no known allergies.  Home Medications   Prior to Admission medications   Medication Sig Start Date End Date Taking? Authorizing Provider  Cholecalciferol (VITAMIN D) 2000 units CAPS Take 1 capsule (2,000 Units total) by mouth daily. 07/05/15  Yes Schuyler Amor, MD  ferrous sulfate 325 (65 FE) MG EC tablet Take 1 tablet (325  mg total) by mouth daily with breakfast. 07/05/15  Yes Schuyler Amor, MD  lisinopril-hydrochlorothiazide (PRINZIDE,ZESTORETIC) 20-25 MG tablet Take 1 tablet by mouth daily. 10/12/15  Yes Schuyler Amor, MD   Meds Ordered and Administered this Visit  Medications - No data to display  BP 130/79 (BP Location: Left Arm)   Pulse 100   Temp 97.7 F (36.5 C) (Oral)   Resp 16   Ht 4\' 11"  (1.499 m)   Wt 223 lb (101.2 kg)   LMP 06/09/2016   SpO2 99%   BMI 45.04 kg/m  Orthostatic VS for the past 24 hrs:  BP- Lying Pulse- Lying BP- Sitting Pulse- Sitting BP- Standing at 0 minutes Pulse- Standing at 0 minutes  06/29/16 1156 126/83 98 127/79 93 120/80 102    Physical Exam  Constitutional: She is oriented to person, place, and time. She appears well-developed and well-nourished. No distress.  HENT:  Head: Normocephalic and atraumatic.  Right Ear: External ear normal.  Left Ear: External ear normal.  Nose: Nose normal.  Mouth/Throat: Oropharynx is clear and moist. No oropharyngeal exudate.  Eyes: EOM are normal. Pupils are equal, round, and reactive to light. Right eye exhibits no discharge. Left eye exhibits no discharge.  Neck: Normal range of motion. Neck supple.  Cardiovascular: Normal rate, regular rhythm and normal heart sounds.   Pulmonary/Chest: Effort normal and breath sounds normal. No respiratory distress. She has no wheezes. She has no rales.  Musculoskeletal: Normal  range of motion.  Neurological: She is alert and oriented to person, place, and time.  Skin: Skin is warm and dry. She is not diaphoretic.  Psychiatric: She has a normal mood and affect. Her behavior is normal. Judgment and thought content normal.  Nursing note and vitals reviewed.   Urgent Care Course     Procedures (including critical care time)  Labs Review Labs Reviewed  GLUCOSE, CAPILLARY  CBG MONITORING, ED    Imaging Review No results found.   Visual Acuity Review  Right Eye Distance:   Left Eye  Distance:   Bilateral Distance:    Right Eye Near:   Left Eye Near:    Bilateral Near:         MDM   1. Lightheadedness    Discharge Medication List as of 06/29/2016 12:26 PM    Plan: 1. Test/x-ray results and diagnosis reviewed with patient 2. rx as per orders; risks, benefits, potential side effects reviewed with patient 3. Recommend supportive treatment with Plenty of fluids. She will need follow-up for Dr. Judithann GravesBerglund on Monday which she already has an appointment. If she worsens she should go to emergency room. At the time of seeing the patient and at the time of discharge she had had no way 9:30 this morning. I have returned to work today. 4. F/u prn if symptoms worsen or don't improve     Lutricia FeilWilliam P Roemer, PA-C 06/29/16 1326

## 2016-06-29 NOTE — ED Triage Notes (Signed)
Patient started having dizziness today.

## 2016-07-03 ENCOUNTER — Ambulatory Visit: Payer: 59 | Admitting: Internal Medicine

## 2016-07-05 ENCOUNTER — Ambulatory Visit (INDEPENDENT_AMBULATORY_CARE_PROVIDER_SITE_OTHER): Payer: 59 | Admitting: Internal Medicine

## 2016-07-05 ENCOUNTER — Encounter: Payer: Self-pay | Admitting: Internal Medicine

## 2016-07-05 VITALS — BP 112/82 | HR 92 | Ht 59.0 in | Wt 223.0 lb

## 2016-07-05 DIAGNOSIS — I10 Essential (primary) hypertension: Secondary | ICD-10-CM

## 2016-07-05 DIAGNOSIS — R42 Dizziness and giddiness: Secondary | ICD-10-CM

## 2016-07-05 NOTE — Progress Notes (Signed)
Date:  07/05/2016   Name:  Nicole Vargas   DOB:  [birth year]   MRN:  [id number]   Chief Complaint: Dizziness (Seen UC here in [place]. Happen when patient has not ate for a while. Pt concerned about Glucose. and diabetes runs in family. Checked sugar at visit and it was 81. ) She has been trying to drink more water,  She has cut out most of her sweets and wonders if her blood sugar is too low as a result. Her orthostatics at [facility] showed mild volume depletion and drop in BP with rise in pulse.  She has been on current BP med for over a year.  BP Readings from Last 3 Encounters:  07/05/16 112/82  06/29/16 130/79  05/30/16 114/82    Review of Systems  Constitutional: Negative for chills, fatigue and fever.  Respiratory: Negative for chest tightness and shortness of breath.   Cardiovascular: Negative for chest pain and palpitations.  Gastrointestinal: Negative for abdominal pain.  Neurological: Positive for light-headedness. Negative for syncope, weakness and headaches.    Patient Active Problem List   Diagnosis Date Noted  . History of difficulty in concentrating 05/30/2016  . Plantar fasciitis, bilateral 04/14/2016  . Iron deficiency 07/05/2015  . Vitamin D deficiency 11/24/2014  . Prediabetes 11/24/2014  . Essential (primary) hypertension 08/06/2014  . Obesity, Class III, BMI 40-49.9 (morbid obesity) (HCC) 08/06/2014    Prior to Admission medications   Medication Sig Start Date End Date Taking? Authorizing Provider  Cholecalciferol (VITAMIN D) 2000 units CAPS Take 1 capsule (2,000 Units total) by mouth daily. 07/05/15  Yes Schuyler Amor, MD  ferrous sulfate 325 (65 FE) MG EC tablet Take 1 tablet (325 mg total) by mouth daily with breakfast. 07/05/15  Yes Schuyler Amor, MD  lisinopril-hydrochlorothiazide (PRINZIDE,ZESTORETIC) 20-25 MG tablet Take 1 tablet by mouth daily. 10/12/15  Yes Schuyler Amor, MD    No Known Allergies  Past Surgical History:  Procedure  Laterality Date  . CESAREAN SECTION  03/04/2000  . CHOLECYSTECTOMY  2006  . TUBAL LIGATION  2006    Social History  Substance Use Topics  . Smoking status: Never Smoker  . Smokeless tobacco: Never Used  . Alcohol use No     Medication list has been reviewed and updated.   Physical Exam  Constitutional: She is oriented to person, place, and time. She appears well-developed. No distress.  HENT:  Head: Normocephalic and atraumatic.  Neck: Normal range of motion. Neck supple. No thyromegaly present.  Cardiovascular: Normal rate, regular rhythm and normal heart sounds.   Pulmonary/Chest: Effort normal and breath sounds normal. No respiratory distress. She has no wheezes.  Musculoskeletal: She exhibits no edema.  Neurological: She is alert and oriented to person, place, and time.  Skin: Skin is warm and dry. No rash noted.  Psychiatric: She has a normal mood and affect. Her behavior is normal. Thought content normal.  Nursing note and vitals reviewed.   BP 112/82 (BP Location: Right Arm, Patient Position: Sitting, Cuff Size: Large)   Pulse 92   Ht 4\' 11"  (1.499 m)   Wt 223 lb (101.2 kg)   LMP 06/09/2016   SpO2 97%   BMI 45.04 kg/m   Assessment and Plan: 1. Essential (primary) hypertension Likely overtreated Reduce dose of medication to 1/2 per day  2. Lightheadedness Continue adequate fluids Follow up if persistent   No orders of the defined types were placed in this encounter.   Bari Edward, MD [facility]  07/05/2016

## 2016-07-05 NOTE — Patient Instructions (Signed)
Cut lisinopril in half - take 1/2 daily.  Keep up hydration.

## 2016-08-23 ENCOUNTER — Encounter: Payer: Self-pay | Admitting: Internal Medicine

## 2016-08-23 ENCOUNTER — Ambulatory Visit (INDEPENDENT_AMBULATORY_CARE_PROVIDER_SITE_OTHER): Payer: 59 | Admitting: Internal Medicine

## 2016-08-23 VITALS — BP 118/64 | HR 96 | Ht 59.0 in | Wt 221.8 lb

## 2016-08-23 DIAGNOSIS — R7303 Prediabetes: Secondary | ICD-10-CM | POA: Diagnosis not present

## 2016-08-23 DIAGNOSIS — Z1231 Encounter for screening mammogram for malignant neoplasm of breast: Secondary | ICD-10-CM | POA: Diagnosis not present

## 2016-08-23 DIAGNOSIS — Z0000 Encounter for general adult medical examination without abnormal findings: Secondary | ICD-10-CM

## 2016-08-23 DIAGNOSIS — Z1239 Encounter for other screening for malignant neoplasm of breast: Secondary | ICD-10-CM

## 2016-08-23 DIAGNOSIS — I10 Essential (primary) hypertension: Secondary | ICD-10-CM

## 2016-08-23 LAB — POCT URINALYSIS DIPSTICK
BILIRUBIN UA: NEGATIVE
Blood, UA: NEGATIVE
Glucose, UA: NEGATIVE
KETONES UA: NEGATIVE
Leukocytes, UA: NEGATIVE
Nitrite, UA: NEGATIVE
PH UA: 5 (ref 5.0–8.0)
PROTEIN UA: NEGATIVE
SPEC GRAV UA: 1.02 (ref 1.010–1.025)
Urobilinogen, UA: 0.2 E.U./dL

## 2016-08-23 NOTE — Patient Instructions (Signed)
Breast Self-Awareness Breast self-awareness means being familiar with how your breasts look and feel. It involves checking your breasts regularly and reporting any changes to your health care provider. Practicing breast self-awareness is important. A change in your breasts can be a sign of a serious medical problem. Being familiar with how your breasts look and feel allows you to find any problems early, when treatment is more likely to be successful. All women should practice breast self-awareness, including women who have had breast implants. How to do a breast self-exam One way to learn what is normal for your breasts and whether your breasts are changing is to do a breast self-exam. To do a breast self-exam: Look for Changes   1. Remove all the clothing above your waist. 2. Stand in front of a mirror in a room with good lighting. 3. Put your hands on your hips. 4. Push your hands firmly downward. 5. Compare your breasts in the mirror. Look for differences between them (asymmetry), such as:  Differences in shape.  Differences in size.  Puckers, dips, and bumps in one breast and not the other. 6. Look at each breast for changes in your skin, such as:  Redness.  Scaly areas. 7. Look for changes in your nipples, such as:  Discharge.  Bleeding.  Dimpling.  Redness.  A change in position. Feel for Changes   Carefully feel your breasts for lumps and changes. It is best to do this while lying on your back on the floor and again while sitting or standing in the shower or tub with soapy water on your skin. Feel each breast in the following way:  Place the arm on the side of the breast you are examining above your head.  Feel your breast with the other hand.  Start in the nipple area and make  inch (2 cm) overlapping circles to feel your breast. Use the pads of your three middle fingers to do this. Apply light pressure, then medium pressure, then firm pressure. The light pressure  will allow you to feel the tissue closest to the skin. The medium pressure will allow you to feel the tissue that is a little deeper. The firm pressure will allow you to feel the tissue close to the ribs.  Continue the overlapping circles, moving downward over the breast until you feel your ribs below your breast.  Move one finger-width toward the center of the body. Continue to use the  inch (2 cm) overlapping circles to feel your breast as you move slowly up toward your collarbone.  Continue the up and down exam using all three pressures until you reach your armpit. Write Down What You Find   Write down what is normal for each breast and any changes that you find. Keep a written record with breast changes or normal findings for each breast. By writing this information down, you do not need to depend only on memory for size, tenderness, or location. Write down where you are in your menstrual cycle, if you are still menstruating. If you are having trouble noticing differences in your breasts, do not get discouraged. With time you will become more familiar with the variations in your breasts and more comfortable with the exam. How often should I examine my breasts? Examine your breasts every month. If you are breastfeeding, the best time to examine your breasts is after a feeding or after using a breast pump. If you menstruate, the best time to examine your breasts is 5-7 days   after your period is over. During your period, your breasts are lumpier, and it may be more difficult to notice changes. When should I see my health care provider? See your health care provider if you notice:  A change in shape or size of your breasts or nipples.  A change in the skin of your breast or nipples, such as a reddened or scaly area.  Unusual discharge from your nipples.  A lump or thick area that was not there before.  Pain in your breasts.  Anything that concerns you. This information is not intended to  replace advice given to you by your health care provider. Make sure you discuss any questions you have with your health care provider. Document Released: 04/10/2005 Document Revised: 09/16/2015 Document Reviewed: 02/28/2015 Elsevier Interactive Patient Education  2017 Elsevier Inc.  

## 2016-08-23 NOTE — Progress Notes (Addendum)
Date:  08/23/2016   Name:  Nicole Vargas   DOB:  [birth year]   MRN:  [id number]   Chief Complaint: Annual Exam (Breast Exam. Pap smear. ) Tracey Aili Casillas is a 47 y.o. female who presents today for her Complete Annual Exam. She feels well. She reports exercising rarely. She reports she is sleeping well. She denies breast complaints.  She is due for a pap smear.  Hypertension  This is a chronic problem. The problem is controlled. Pertinent negatives include no chest pain, headaches, palpitations or shortness of breath.    Review of Systems  Constitutional: Negative for chills, fatigue and fever.  HENT: Negative for congestion, hearing loss, tinnitus, trouble swallowing and voice change.   Eyes: Negative for visual disturbance.  Respiratory: Negative for cough, chest tightness, shortness of breath and wheezing.   Cardiovascular: Negative for chest pain, palpitations and leg swelling.  Gastrointestinal: Negative for abdominal pain, constipation, diarrhea and vomiting.  Endocrine: Negative for polydipsia and polyuria.  Genitourinary: Negative for dysuria, frequency, genital sores, vaginal bleeding and vaginal discharge.  Musculoskeletal: Negative for arthralgias, gait problem and joint swelling.  Skin: Negative for color change and rash.  Neurological: Negative for dizziness, tremors, light-headedness and headaches.  Hematological: Negative for adenopathy. Does not bruise/bleed easily.  Psychiatric/Behavioral: Negative for dysphoric mood and sleep disturbance. The patient is not nervous/anxious.     Patient Active Problem List   Diagnosis Date Noted  . History of difficulty in concentrating 05/30/2016  . Plantar fasciitis, bilateral 04/14/2016  . Iron deficiency 07/05/2015  . Vitamin D deficiency 11/24/2014  . Prediabetes 11/24/2014  . Essential (primary) hypertension 08/06/2014  . Obesity, Class III, BMI 40-49.9 (morbid obesity) (HCC) 08/06/2014    Prior to  Admission medications   Medication Sig Start Date End Date Taking? Authorizing Provider  Cholecalciferol (VITAMIN D) 2000 units CAPS Take 1 capsule (2,000 Units total) by mouth daily. 07/05/15  Yes Schuyler Amor, MD  ferrous sulfate 325 (65 FE) MG EC tablet Take 1 tablet (325 mg total) by mouth daily with breakfast. 07/05/15  Yes Schuyler Amor, MD  lisinopril-hydrochlorothiazide (PRINZIDE,ZESTORETIC) 20-25 MG tablet Take 1 tablet by mouth daily. Patient taking differently: Take 0.5 tablets by mouth daily.  10/12/15  Yes Schuyler Amor, MD    No Known Allergies  Past Surgical History:  Procedure Laterality Date  . CESAREAN SECTION  03/04/2000  . CHOLECYSTECTOMY  2006  . TUBAL LIGATION  2006    Social History  Substance Use Topics  . Smoking status: Never Smoker  . Smokeless tobacco: Never Used  . Alcohol use No   Depression screen PHQ 2/9 08/23/2016  Decreased Interest 0  Down, Depressed, Hopeless 0  PHQ - 2 Score 0     Medication list has been reviewed and updated.   Physical Exam  Constitutional: She is oriented to person, place, and time. She appears well-developed and well-nourished. No distress.  HENT:  Head: Normocephalic and atraumatic.  Right Ear: Tympanic membrane and ear canal normal.  Left Ear: Tympanic membrane and ear canal normal.  Nose: Right sinus exhibits no maxillary sinus tenderness. Left sinus exhibits no maxillary sinus tenderness.  Mouth/Throat: Uvula is midline and oropharynx is clear and moist.  Eyes: Conjunctivae and EOM are normal. Right eye exhibits no discharge. Left eye exhibits no discharge. No scleral icterus.  Neck: Normal range of motion. Carotid bruit is not present. No erythema present. No thyromegaly present.  Cardiovascular: Normal rate, regular rhythm, normal heart sounds and normal  pulses.   Pulmonary/Chest: Effort normal. No respiratory distress. She has no wheezes. Right breast exhibits no mass, no nipple discharge, no skin change and no  tenderness. Left breast exhibits no mass, no nipple discharge, no skin change and no tenderness.  Abdominal: Soft. Bowel sounds are normal. There is no hepatosplenomegaly. There is no tenderness. There is no CVA tenderness.  Genitourinary: Vagina normal and uterus normal. There is no tenderness, lesion or injury on the right labia. There is no tenderness, lesion or injury on the left labia. Cervix exhibits no motion tenderness, no discharge and no friability. Right adnexum displays no mass, no tenderness and no fullness. Left adnexum displays no mass, no tenderness and no fullness.  Musculoskeletal: Normal range of motion.  Lymphadenopathy:    She has no cervical adenopathy.    She has no axillary adenopathy.  Neurological: She is alert and oriented to person, place, and time. She has normal reflexes. No cranial nerve deficit or sensory deficit.  Skin: Skin is warm, dry and intact. No rash noted.  Psychiatric: She has a normal mood and affect. Her speech is normal and behavior is normal. Thought content normal.  Nursing note and vitals reviewed.   BP 118/64 (BP Location: Right Arm, Patient Position: Sitting, Cuff Size: Large)   Pulse 96   Ht  (1.499 m)   Wt 221 lb 12.8 oz (100.6 kg)   LMP 08/13/2016 (Exact Date)   SpO2 97%   BMI 44.80 kg/m   Assessment and Plan: 1. Annual physical exam Continue work on diet and weight loss - Lipid panel - Pap IG and HPV (high risk) DNA detection - POCT urinalysis dipstick  2. Breast cancer screening Pt to schedule - MM DIGITAL SCREENING BILATERAL; Future  3. Essential (primary) hypertension controlled - CBC with Differential/Platelet - Comprehensive metabolic panel - TSH  4. Prediabetes Continue diet - Hemoglobin A1c  5. Obesity, Class III, BMI 40-49.9 (morbid obesity) (HCC) Has made some progress Dietary guidelines discussed   No orders of the defined types were placed in this encounter.   Bari Edward, MD [facility] Medical  Clinic [facility]  08/23/2016

## 2016-08-24 ENCOUNTER — Encounter: Payer: Self-pay | Admitting: Internal Medicine

## 2016-08-24 DIAGNOSIS — E782 Mixed hyperlipidemia: Secondary | ICD-10-CM | POA: Insufficient documentation

## 2016-08-24 LAB — CBC WITH DIFFERENTIAL/PLATELET
BASOS: 0 %
Basophils Absolute: 0 10*3/uL (ref 0.0–0.2)
EOS (ABSOLUTE): 0.2 10*3/uL (ref 0.0–0.4)
EOS: 3 %
HEMATOCRIT: 42 % (ref 34.0–46.6)
Hemoglobin: 13.9 g/dL (ref 11.1–15.9)
IMMATURE GRANULOCYTES: 0 %
Immature Grans (Abs): 0 10*3/uL (ref 0.0–0.1)
LYMPHS ABS: 2 10*3/uL (ref 0.7–3.1)
Lymphs: 26 %
MCH: 30.1 pg (ref 26.6–33.0)
MCHC: 33.1 g/dL (ref 31.5–35.7)
MCV: 91 fL (ref 79–97)
MONOS ABS: 0.5 10*3/uL (ref 0.1–0.9)
Monocytes: 7 %
NEUTROS PCT: 64 %
Neutrophils Absolute: 4.8 10*3/uL (ref 1.4–7.0)
PLATELETS: 289 10*3/uL (ref 150–379)
RBC: 4.62 x10E6/uL (ref 3.77–5.28)
RDW: 13 % (ref 12.3–15.4)
WBC: 7.6 10*3/uL (ref 3.4–10.8)

## 2016-08-24 LAB — COMPREHENSIVE METABOLIC PANEL
A/G RATIO: 1.6 (ref 1.2–2.2)
ALK PHOS: 33 IU/L — AB (ref 39–117)
ALT: 13 IU/L (ref 0–32)
AST: 15 IU/L (ref 0–40)
Albumin: 4.2 g/dL (ref 3.5–5.5)
BUN/Creatinine Ratio: 18 (ref 9–23)
BUN: 12 mg/dL (ref 6–24)
Bilirubin Total: 0.5 mg/dL (ref 0.0–1.2)
CO2: 23 mmol/L (ref 18–29)
Calcium: 9.5 mg/dL (ref 8.7–10.2)
Chloride: 98 mmol/L (ref 96–106)
Creatinine, Ser: 0.68 mg/dL (ref 0.57–1.00)
GFR calc Af Amer: 123 mL/min/{1.73_m2} (ref >59)
GFR calc non Af Amer: 107 mL/min/{1.73_m2} (ref >59)
GLOBULIN, TOTAL: 2.7 g/dL (ref 1.5–4.5)
Glucose: 81 mg/dL (ref 65–99)
POTASSIUM: 4.5 mmol/L (ref 3.5–5.2)
SODIUM: 138 mmol/L (ref 134–144)
Total Protein: 6.9 g/dL (ref 6.0–8.5)

## 2016-08-24 LAB — LIPID PANEL
CHOLESTEROL TOTAL: 218 mg/dL — AB (ref 100–199)
Chol/HDL Ratio: 4.4 ratio (ref 0.0–4.4)
HDL: 49 mg/dL (ref >39)
LDL Calculated: 140 mg/dL — ABNORMAL HIGH (ref 0–99)
Triglycerides: 145 mg/dL (ref 0–149)
VLDL Cholesterol Cal: 29 mg/dL (ref 5–40)

## 2016-08-24 LAB — HEMOGLOBIN A1C
ESTIMATED AVERAGE GLUCOSE: 111 mg/dL
HEMOGLOBIN A1C: 5.5 % (ref 4.8–5.6)

## 2016-08-24 LAB — TSH: TSH: 1.57 u[IU]/mL (ref 0.450–4.500)

## 2016-08-25 LAB — PAP IG AND HPV HIGH-RISK
HPV, HIGH-RISK: NEGATIVE
PAP SMEAR COMMENT: 0

## 2016-09-07 ENCOUNTER — Ambulatory Visit
Admission: RE | Admit: 2016-09-07 | Discharge: 2016-09-07 | Disposition: A | Discharge status: Home / Self Care | Payer: 59 | Source: Ambulatory Visit | Attending: Internal Medicine | Admitting: Internal Medicine

## 2016-09-07 DIAGNOSIS — Z1231 Encounter for screening mammogram for malignant neoplasm of breast: Secondary | ICD-10-CM | POA: Diagnosis not present

## 2016-09-07 DIAGNOSIS — Z1239 Encounter for other screening for malignant neoplasm of breast: Secondary | ICD-10-CM

## 2016-09-07 DIAGNOSIS — R928 Other abnormal and inconclusive findings on diagnostic imaging of breast: Secondary | ICD-10-CM | POA: Insufficient documentation

## 2016-09-13 ENCOUNTER — Inpatient Hospital Stay
Admission: RE | Admit: 2016-09-13 | Discharge: 2016-09-13 | Disposition: A | Discharge status: Home / Self Care | Payer: Self-pay | Source: Ambulatory Visit | Attending: *Deleted | Admitting: *Deleted

## 2016-09-13 ENCOUNTER — Other Ambulatory Visit: Payer: Self-pay | Admitting: *Deleted

## 2016-09-13 DIAGNOSIS — Z9289 Personal history of other medical treatment: Secondary | ICD-10-CM

## 2016-09-14 ENCOUNTER — Other Ambulatory Visit: Payer: Self-pay | Admitting: Internal Medicine

## 2016-09-14 DIAGNOSIS — R928 Other abnormal and inconclusive findings on diagnostic imaging of breast: Secondary | ICD-10-CM

## 2016-09-14 DIAGNOSIS — N6489 Other specified disorders of breast: Secondary | ICD-10-CM

## 2016-09-28 ENCOUNTER — Other Ambulatory Visit: Payer: 59

## 2016-09-28 ENCOUNTER — Ambulatory Visit: Payer: 59

## 2016-10-02 ENCOUNTER — Ambulatory Visit
Admission: RE | Admit: 2016-10-02 | Discharge: 2016-10-02 | Disposition: A | Discharge status: Home / Self Care | Payer: 59 | Source: Ambulatory Visit | Attending: Physician Assistant | Admitting: Physician Assistant

## 2016-10-02 DIAGNOSIS — R921 Mammographic calcification found on diagnostic imaging of breast: Secondary | ICD-10-CM | POA: Insufficient documentation

## 2016-10-02 DIAGNOSIS — R922 Inconclusive mammogram: Secondary | ICD-10-CM | POA: Diagnosis not present

## 2016-10-02 DIAGNOSIS — N6489 Other specified disorders of breast: Secondary | ICD-10-CM

## 2016-10-02 DIAGNOSIS — R928 Other abnormal and inconclusive findings on diagnostic imaging of breast: Secondary | ICD-10-CM

## 2016-10-03 ENCOUNTER — Other Ambulatory Visit: Payer: Self-pay | Admitting: Physician Assistant

## 2016-10-03 DIAGNOSIS — R921 Mammographic calcification found on diagnostic imaging of breast: Secondary | ICD-10-CM

## 2016-10-03 DIAGNOSIS — R928 Other abnormal and inconclusive findings on diagnostic imaging of breast: Secondary | ICD-10-CM

## 2016-10-06 ENCOUNTER — Other Ambulatory Visit: Payer: Self-pay

## 2016-10-06 MED ORDER — LISINOPRIL-HYDROCHLOROTHIAZIDE 20-25 MG PO TABS: 1.0000 | ORAL_TABLET | Freq: Every day | ORAL | 3 refills | Status: DC

## 2016-10-09 ENCOUNTER — Ambulatory Visit
Admission: RE | Admit: 2016-10-09 | Discharge: 2016-10-09 | Disposition: A | Discharge status: Home / Self Care | Payer: 59 | Source: Ambulatory Visit | Attending: Internal Medicine | Admitting: Internal Medicine

## 2016-10-09 DIAGNOSIS — R921 Mammographic calcification found on diagnostic imaging of breast: Secondary | ICD-10-CM | POA: Insufficient documentation

## 2016-10-09 DIAGNOSIS — R928 Other abnormal and inconclusive findings on diagnostic imaging of breast: Secondary | ICD-10-CM | POA: Diagnosis not present

## 2016-10-09 DIAGNOSIS — N6012 Diffuse cystic mastopathy of left breast: Secondary | ICD-10-CM | POA: Diagnosis not present

## 2016-10-09 HISTORY — PX: BREAST BIOPSY: SHX20

## 2016-10-10 LAB — SURGICAL PATHOLOGY

## 2016-10-11 ENCOUNTER — Encounter: Payer: Self-pay | Admitting: Internal Medicine

## 2016-10-11 DIAGNOSIS — Z9889 Other specified postprocedural states: Secondary | ICD-10-CM | POA: Insufficient documentation

## 2016-12-07 ENCOUNTER — Ambulatory Visit: Payer: Self-pay | Admitting: Internal Medicine

## 2017-02-23 ENCOUNTER — Ambulatory Visit
Admission: RE | Admit: 2017-02-23 | Discharge: 2017-02-23 | Disposition: A | Discharge status: Home / Self Care | Payer: 59 | Source: Ambulatory Visit | Attending: Internal Medicine | Admitting: Internal Medicine

## 2017-02-23 ENCOUNTER — Ambulatory Visit (INDEPENDENT_AMBULATORY_CARE_PROVIDER_SITE_OTHER): Payer: 59 | Admitting: Internal Medicine

## 2017-02-23 ENCOUNTER — Encounter: Payer: Self-pay | Admitting: Internal Medicine

## 2017-02-23 VITALS — BP 118/84 | HR 71 | Ht 59.0 in | Wt 213.0 lb

## 2017-02-23 DIAGNOSIS — Z23 Encounter for immunization: Secondary | ICD-10-CM

## 2017-02-23 DIAGNOSIS — M25571 Pain in right ankle and joints of right foot: Secondary | ICD-10-CM | POA: Diagnosis not present

## 2017-02-23 DIAGNOSIS — M7731 Calcaneal spur, right foot: Secondary | ICD-10-CM | POA: Insufficient documentation

## 2017-02-23 DIAGNOSIS — Z9889 Other specified postprocedural states: Secondary | ICD-10-CM

## 2017-02-23 DIAGNOSIS — R7303 Prediabetes: Secondary | ICD-10-CM | POA: Diagnosis not present

## 2017-02-23 DIAGNOSIS — M7989 Other specified soft tissue disorders: Secondary | ICD-10-CM | POA: Insufficient documentation

## 2017-02-23 DIAGNOSIS — I10 Essential (primary) hypertension: Secondary | ICD-10-CM

## 2017-02-23 DIAGNOSIS — M19071 Primary osteoarthritis, right ankle and foot: Secondary | ICD-10-CM | POA: Diagnosis not present

## 2017-02-23 NOTE — Patient Instructions (Addendum)
Take tylenol 500 mg three times a day.  You can take up to 4 of them per day.

## 2017-02-23 NOTE — Progress Notes (Signed)
Date:  02/23/2017   Name:  Nicole Vargas   DOB:  1970/12/12   MRN:  [id number]   Chief Complaint: Hypertension; Immunizations (Flu Shot); and Ankle Pain (R Ankle. Hurts after working the night before. Wakes up and feels like can't step down on ankle. Sometimes cannot bear weight. Wednesday stepped funny at work, and felt a strong ache and could not put weight on it. Been hurting off and on for over a month. ) Hypertension  This is a chronic problem. The problem is unchanged. The problem is controlled. Pertinent negatives include no chest pain, headaches, palpitations or shortness of breath.  Ankle Pain   There was no injury mechanism. The pain is present in the right ankle. The pain has been worsening since onset. Associated symptoms include an inability to bear weight. Pertinent negatives include no numbness or tingling. She reports no foreign bodies present. The symptoms are aggravated by palpation, weight bearing and movement. She has tried acetaminophen (and soft brace) for the symptoms. The treatment provided mild relief.   Pre-diabetes - doing well with last labs good.  Weight is down 7 lbs.  Breast Bx - done in June and was negative.  Will return to annual screenings in May 2019.   Review of Systems  Constitutional: Negative for appetite change, fatigue, fever and unexpected weight change.  HENT: Negative for trouble swallowing.   Eyes: Negative for visual disturbance.  Respiratory: Negative for cough, chest tightness and shortness of breath.   Cardiovascular: Negative for chest pain, palpitations and leg swelling.  Gastrointestinal: Negative for abdominal pain.  Endocrine: Negative for polydipsia and polyuria.  Genitourinary: Negative for dysuria and hematuria.  Musculoskeletal: Positive for arthralgias (right ankle).  Neurological: Negative for tingling, tremors, numbness and headaches.  Hematological: Negative for adenopathy.  Psychiatric/Behavioral: Negative for  dysphoric mood.    Patient Active Problem List   Diagnosis Date Noted  . S/P breast biopsy, left 10/11/2016  . Hyperlipidemia, mixed 08/24/2016  . History of difficulty in concentrating 05/30/2016  . Plantar fasciitis, bilateral 04/14/2016  . Iron deficiency 07/05/2015  . Vitamin D deficiency 11/24/2014  . Prediabetes 11/24/2014  . Essential (primary) hypertension 08/06/2014  . Obesity, Class III, BMI 40-49.9 (morbid obesity) (HCC) 08/06/2014    Prior to Admission medications   Medication Sig Start Date End Date Taking? Authorizing Provider  Cholecalciferol (VITAMIN D) 2000 units CAPS Take 1 capsule (2,000 Units total) by mouth daily. 07/05/15   Plonk, Chrissie Noa, MD  ferrous sulfate 325 (65 FE) MG EC tablet Take 1 tablet (325 mg total) by mouth daily with breakfast. 07/05/15   Plonk, Chrissie Noa, MD  lisinopril-hydrochlorothiazide (PRINZIDE,ZESTORETIC) 20-25 MG tablet Take 1 tablet by mouth daily. 10/06/16   Reubin Milan, MD    No Known Allergies  Past Surgical History:  Procedure Laterality Date  . BREAST BIOPSY Left 10/09/2016   [facility]fffirm Biopsy- path pending  . CESAREAN SECTION  03/04/2000  . CHOLECYSTECTOMY  2006  . TUBAL LIGATION  2006    Social History  Substance Use Topics  . Smoking status: Never Smoker  . Smokeless tobacco: Never Used  . Alcohol use No     Medication list has been reviewed and updated.  PHQ 2/9 Scores 08/23/2016  PHQ - 2 Score 0    Physical Exam  Constitutional: She is oriented to person, place, and time. She appears well-developed. No distress.  HENT:  Head: Normocephalic and atraumatic.  Neck: Normal range of motion. Neck supple. No thyromegaly present.  Cardiovascular: Normal rate, regular rhythm and normal heart sounds.   Pulses:      Dorsalis pedis pulses are 2+ on the right side, and 2+ on the left side.       Posterior tibial pulses are 2+ on the right side, and 2+ on the left side.  Pulmonary/Chest: Effort normal and breath sounds  normal. No respiratory distress. She has no wheezes.  Musculoskeletal: She exhibits no edema.  Mild soft tissue swelling of medial and lateral ankle on right No plantar tenderness of either foot  Neurological: She is alert and oriented to person, place, and time. She has normal strength. No sensory deficit.  Skin: Skin is warm and dry. No rash noted.  Psychiatric: She has a normal mood and affect. Her behavior is normal. Thought content normal.  Nursing note and vitals reviewed.   BP 118/84   Pulse 71   Ht 4\' 11"  (1.499 m)   Wt 213 lb (96.6 kg)   LMP 02/13/2017 (Exact Date)   SpO2 100%   BMI 43.02 kg/m   Assessment and Plan: 1. Essential (primary) hypertension controlled  2. Prediabetes Doing well with recent weight loss  3. S/P breast biopsy, left Negative - returning to annual screenings  4. Need for influenza vaccination - Flu Vaccine QUAD 36+ mos IM  5. 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   Partially dictated using Animal nutritionistDragon software. Any errors are unintentional.  Bari EdwardLaura Berglund, MD [facility]  02/23/2017

## 2017-05-25 ENCOUNTER — Ambulatory Visit: Payer: 59 | Admitting: Internal Medicine

## 2017-05-25 ENCOUNTER — Encounter: Payer: Self-pay | Admitting: Internal Medicine

## 2017-05-25 VITALS — BP 118/64 | HR 68 | Temp 98.0°F | Ht 59.0 in | Wt 214.0 lb

## 2017-05-25 DIAGNOSIS — J0100 Acute maxillary sinusitis, unspecified: Secondary | ICD-10-CM

## 2017-05-25 MED ORDER — AMOXICILLIN-POT CLAVULANATE 875-125 MG PO TABS: 1.0000 | ORAL_TABLET | Freq: Two times a day (BID) | ORAL | 0 refills | Status: AC

## 2017-05-25 NOTE — Progress Notes (Signed)
Date:  05/25/2017   Name:  Nicole Vargas   DOB:  October 08, 1970   MRN:  [id number]   Chief Complaint: Cough (Started 5 days ago- some production- not always. No fever. Nose congestion. ) Cough  This is a new problem. The current episode started in the past 7 days. The problem has been gradually improving. The problem occurs every few minutes. The cough is productive of sputum. Associated symptoms include postnasal drip and a sore throat. Pertinent negatives include no chest pain, chills, fever, headaches, shortness of breath or wheezing.  She also has ear congestion and sinus drainage, voice change and green nasal discharge.    Review of Systems  Constitutional: Negative for chills, fatigue and fever.  HENT: Positive for congestion, postnasal drip, sinus pressure, sinus pain, sore throat and voice change. Negative for hearing loss.   Respiratory: Positive for cough and chest tightness. Negative for shortness of breath and wheezing.   Cardiovascular: Negative for chest pain, palpitations and leg swelling.  Gastrointestinal: Negative for abdominal pain, diarrhea and vomiting.  Neurological: Negative for dizziness and headaches.    Patient Active Problem List   Diagnosis Date Noted  . Arthritis of ankle, right 02/23/2017  . S/P breast biopsy, left 10/11/2016  . Hyperlipidemia, mixed 08/24/2016  . History of difficulty in concentrating 05/30/2016  . Plantar fasciitis, bilateral 04/14/2016  . Iron deficiency 07/05/2015  . Vitamin D deficiency 11/24/2014  . Prediabetes 11/24/2014  . Essential (primary) hypertension 08/06/2014  . Obesity, Class III, BMI 40-49.9 (morbid obesity) (HCC) 08/06/2014    Prior to Admission medications   Medication Sig Start Date End Date Taking? Authorizing Provider  Cholecalciferol (VITAMIN D) 2000 units CAPS Take 1 capsule (2,000 Units total) by mouth daily. 07/05/15  Yes Plonk, Chrissie Noa, MD  ferrous sulfate 325 (65 FE) MG EC tablet Take 1 tablet  (325 mg total) by mouth daily with breakfast. 07/05/15  Yes Plonk, Chrissie Noa, MD  lisinopril-hydrochlorothiazide (PRINZIDE,ZESTORETIC) 20-25 MG tablet Take 1 tablet by mouth daily. Patient taking differently: Take 0.5 tablets by mouth daily.  10/06/16  Yes Reubin Milan, MD    No Known Allergies  Past Surgical History:  Procedure Laterality Date  . BREAST BIOPSY Left 10/09/2016   Afffirm Biopsy- path pending  . CESAREAN SECTION  03/04/2000  . CHOLECYSTECTOMY  2006  . TUBAL LIGATION  2006    Social History   Tobacco Use  . Smoking status: Never Smoker  . Smokeless tobacco: Never Used  Substance Use Topics  . Alcohol use: No    Alcohol/week: 0.0 oz  . Drug use: No     Medication list has been reviewed and updated.  PHQ 2/9 Scores 08/23/2016  PHQ - 2 Score 0    Physical Exam  Constitutional: She is oriented to person, place, and time. She appears well-developed and well-nourished.  HENT:  Right Ear: External ear and ear canal normal. Tympanic membrane is erythematous and retracted.  Left Ear: External ear and ear canal normal. Tympanic membrane is erythematous and retracted.  Nose: Right sinus exhibits maxillary sinus tenderness and frontal sinus tenderness. Left sinus exhibits maxillary sinus tenderness and frontal sinus tenderness.  Mouth/Throat: Uvula is midline and mucous membranes are normal. No oral lesions. Posterior oropharyngeal erythema present. No oropharyngeal exudate.  Cardiovascular: Normal rate, regular rhythm and normal heart sounds.  Pulmonary/Chest: Effort normal and breath sounds normal. She has no wheezes. She has no rales.  Lymphadenopathy:    She has no cervical adenopathy.  Neurological:  She is alert and oriented to person, place, and time.    BP 118/64   Pulse 68   Temp 98 F (36.7 C) (Oral)   Ht 4\' 11"  (1.499 m)   Wt 214 lb (97.1 kg)   SpO2 97%   BMI 43.22 kg/m   Assessment and Plan: 1. Acute non-recurrent maxillary sinusitis Continue  otc cold and sinus meds Note to be out of work today and tomorrow - amoxicillin-clavulanate (AUGMENTIN) 875-125 MG tablet; Take 1 tablet by mouth 2 (two) times daily for 10 days.  Dispense: 20 tablet; Refill: 0   Meds ordered this encounter  Medications  . amoxicillin-clavulanate (AUGMENTIN) 875-125 MG tablet    Sig: Take 1 tablet by mouth 2 (two) times daily for 10 days.    Dispense:  20 tablet    Refill:  0    Partially dictated using Animal nutritionistDragon software. Any errors are unintentional.  Bari EdwardLaura Berglund, MD [facility]  05/25/2017

## 2017-06-19 ENCOUNTER — Ambulatory Visit: Payer: 59 | Admitting: Internal Medicine

## 2017-06-19 ENCOUNTER — Encounter: Payer: Self-pay | Admitting: Internal Medicine

## 2017-06-19 VITALS — BP 118/78 | HR 87 | Ht 59.0 in | Wt 214.0 lb

## 2017-06-19 DIAGNOSIS — N3000 Acute cystitis without hematuria: Secondary | ICD-10-CM

## 2017-06-19 LAB — POCT URINALYSIS DIPSTICK
BILIRUBIN UA: NEGATIVE
Blood, UA: NEGATIVE
GLUCOSE UA: NEGATIVE
Ketones, UA: NEGATIVE
Leukocytes, UA: NEGATIVE
Nitrite, UA: NEGATIVE
Protein, UA: NEGATIVE
Spec Grav, UA: 1.015 (ref 1.010–1.025)
Urobilinogen, UA: 0.2 E.U./dL
pH, UA: 6.5 (ref 5.0–8.0)

## 2017-06-19 MED ORDER — NITROFURANTOIN MONOHYD MACRO 100 MG PO CAPS: 100.0000 mg | ORAL_CAPSULE | Freq: Two times a day (BID) | ORAL | 0 refills | Status: AC

## 2017-06-19 NOTE — Progress Notes (Signed)
Date:  06/19/2017   Name:  Nicole Vargas   DOB:  08/29/1970   MRN:  [id number]   Chief Complaint: Urinary Tract Infection (Burning when urination. Hurting when wiping. Started a week ago. Had sexual intercourse with husband and this started after. Unsure if UTI or what?)  Dysuria   This is a new problem. The current episode started in the past 7 days. The problem occurs every urination. The quality of the pain is described as burning. There has been no fever. She is sexually active. There is no history of pyelonephritis. Pertinent negatives include no chills, frequency, hematuria or urgency. She has tried nothing for the symptoms.  Vaginal Pain  This is a new problem. The current episode started in the past 7 days. The problem has been unchanged. Associated symptoms include dysuria. Pertinent negatives include no chills, fever, frequency, hematuria or urgency. She has tried antifungals for the symptoms. The treatment provided no relief. She is sexually active.  Her dysuria is gradually improving.  She denies any vaginal discharge, itching or odor.  She tried otc yeast medication with no improvement.  She has been drinking more fluids.   Review of Systems  Constitutional: Negative for chills and fever.  Respiratory: Negative for chest tightness and shortness of breath.   Cardiovascular: Negative for chest pain.  Genitourinary: Positive for dysuria and vaginal pain. Negative for frequency, hematuria and urgency.    Patient Active Problem List   Diagnosis Date Noted  . Arthritis of ankle, right 02/23/2017  . S/P breast biopsy, left 10/11/2016  . Hyperlipidemia, mixed 08/24/2016  . History of difficulty in concentrating 05/30/2016  . Plantar fasciitis, bilateral 04/14/2016  . Iron deficiency 07/05/2015  . Vitamin D deficiency 11/24/2014  . Prediabetes 11/24/2014  . Essential (primary) hypertension 08/06/2014  . Obesity, Class III, BMI 40-49.9 (morbid obesity) (HCC)  08/06/2014    Prior to Admission medications   Medication Sig Start Date End Date Taking? Authorizing Provider  Cholecalciferol (VITAMIN D) 2000 units CAPS Take 1 capsule (2,000 Units total) by mouth daily. 07/05/15  Yes Plonk, Chrissie Noa, MD  ferrous sulfate 325 (65 FE) MG EC tablet Take 1 tablet (325 mg total) by mouth daily with breakfast. 07/05/15  Yes Plonk, Chrissie Noa, MD  lisinopril-hydrochlorothiazide (PRINZIDE,ZESTORETIC) 20-25 MG tablet Take 1 tablet by mouth daily. Patient taking differently: Take 0.5 tablets by mouth daily.  10/06/16  Yes Reubin Milan, MD    No Known Allergies  Past Surgical History:  Procedure Laterality Date  . BREAST BIOPSY Left 10/09/2016   Afffirm Biopsy- path pending  . CESAREAN SECTION  03/04/2000  . CHOLECYSTECTOMY  2006  . TUBAL LIGATION  2006    Social History   Tobacco Use  . Smoking status: Never Smoker  . Smokeless tobacco: Never Used  Substance Use Topics  . Alcohol use: No    Alcohol/week: 0.0 oz  . Drug use: No     Medication list has been reviewed and updated.  PHQ 2/9 Scores 08/23/2016  PHQ - 2 Score 0    Physical Exam  Constitutional: She appears well-developed and well-nourished.  Cardiovascular: Normal rate, regular rhythm and normal heart sounds.  Pulmonary/Chest: Effort normal and breath sounds normal. No respiratory distress.  Abdominal: Soft. Bowel sounds are normal. There is no tenderness. There is no rebound, no guarding and no CVA tenderness.  Psychiatric: She has a normal mood and affect.  Nursing note and vitals reviewed.   BP 118/78   Pulse 87  Ht 4\' 11"  (1.499 m)   Wt 214 lb (97.1 kg)   SpO2 100%   BMI 43.22 kg/m   Assessment and Plan: 1. Acute cystitis without hematuria Mostly resolved UTI - will give 5 days of macrobid since she is prediabetic. - POCT Urinalysis Dipstick - nitrofurantoin, macrocrystal-monohydrate, (MACROBID) 100 MG capsule; Take 1 capsule (100 mg total) by mouth 2 (two) times daily  for 5 days.  Dispense: 10 capsule; Refill: 0   Meds ordered this encounter  Medications  . nitrofurantoin, macrocrystal-monohydrate, (MACROBID) 100 MG capsule    Sig: Take 1 capsule (100 mg total) by mouth 2 (two) times daily for 5 days.    Dispense:  10 capsule    Refill:  0    Partially dictated using Animal nutritionistDragon software. Any errors are unintentional.  Bari EdwardLaura Fletcher Ostermiller, MD [facility]  06/19/2017

## 2017-09-12 ENCOUNTER — Encounter: Payer: Self-pay | Admitting: Internal Medicine

## 2017-09-12 ENCOUNTER — Ambulatory Visit (INDEPENDENT_AMBULATORY_CARE_PROVIDER_SITE_OTHER): Payer: 59 | Admitting: Internal Medicine

## 2017-09-12 VITALS — BP 122/84 | HR 94 | Resp 16 | Ht 59.0 in | Wt 215.0 lb

## 2017-09-12 DIAGNOSIS — G43009 Migraine without aura, not intractable, without status migrainosus: Secondary | ICD-10-CM | POA: Insufficient documentation

## 2017-09-12 DIAGNOSIS — R7303 Prediabetes: Secondary | ICD-10-CM | POA: Diagnosis not present

## 2017-09-12 DIAGNOSIS — E782 Mixed hyperlipidemia: Secondary | ICD-10-CM | POA: Diagnosis not present

## 2017-09-12 DIAGNOSIS — I10 Essential (primary) hypertension: Secondary | ICD-10-CM

## 2017-09-12 DIAGNOSIS — Z0000 Encounter for general adult medical examination without abnormal findings: Secondary | ICD-10-CM

## 2017-09-12 DIAGNOSIS — Z1239 Encounter for other screening for malignant neoplasm of breast: Secondary | ICD-10-CM

## 2017-09-12 LAB — POCT URINALYSIS DIPSTICK
Bilirubin, UA: NEGATIVE
Glucose, UA: NEGATIVE
KETONES UA: NEGATIVE
Leukocytes, UA: NEGATIVE
NITRITE UA: NEGATIVE
PROTEIN UA: NEGATIVE
RBC UA: NEGATIVE
SPEC GRAV UA: 1.025 (ref 1.010–1.025)
Urobilinogen, UA: 0.2 E.U./dL
pH, UA: 6.5 (ref 5.0–8.0)

## 2017-09-12 MED ORDER — LISINOPRIL-HYDROCHLOROTHIAZIDE 20-25 MG PO TABS: 1.0000 | ORAL_TABLET | Freq: Every day | ORAL | 3 refills | Status: DC

## 2017-09-12 NOTE — Progress Notes (Signed)
Date:  09/12/2017   Name:  Nicole Vargas   DOB:  12-04-1970   MRN:  [id number]   Chief Complaint: Annual Exam (Did not fast..Marland KitchenHad rice crispies with milk and no sugar ) and Headache (Woke up with beginning of migraine that started in neck and now is going around forehead. Has them around cycle and different stressors. )  Nicole Vargas is a 47 y.o. female who presents today for her Complete Annual Exam. She feels well. She reports exercising none. She reports she is sleeping fairly well. Pap smear is up to date.  Mammogram is due in June - routine screening after negative bx last year.  Headache   This is a recurrent problem. The problem occurs monthly. The problem has been unchanged. The pain is located in the occipital region. The pain radiates to the face. The pain quality is similar to prior headaches. The quality of the pain is described as aching and shooting. The pain is mild. Pertinent negatives include no abdominal pain, coughing, dizziness, fever, hearing loss, tinnitus or vomiting. Her past medical history is significant for hypertension.  Hypertension  This is a chronic problem. The problem is controlled. Associated symptoms include headaches. Pertinent negatives include no chest pain, palpitations or shortness of breath. Past treatments include diuretics and ACE inhibitors.  Hyperlipidemia  This is a chronic problem. The problem is uncontrolled. Pertinent negatives include no chest pain or shortness of breath. Current antihyperlipidemic treatment includes diet change.  Pre-diabetes - following a fairly healthy diet.  No vision changes, neuropathy, weakness, chest pain.  Lab Results  Component Value Date   HGBA1C 5.5 08/23/2016    Review of Systems  Constitutional: Negative for chills, fatigue and fever.  HENT: Negative for congestion, hearing loss, tinnitus, trouble swallowing and voice change.   Eyes: Negative for visual disturbance.  Respiratory:  Negative for cough, chest tightness, shortness of breath and wheezing.   Cardiovascular: Negative for chest pain, palpitations and leg swelling.  Gastrointestinal: Negative for abdominal pain, constipation, diarrhea and vomiting.  Endocrine: Negative for polydipsia and polyuria.  Genitourinary: Negative for dysuria, frequency, genital sores, vaginal bleeding and vaginal discharge.  Musculoskeletal: Negative for arthralgias, gait problem and joint swelling.  Skin: Negative for color change and rash.  Neurological: Positive for headaches. Negative for dizziness, tremors and light-headedness.  Hematological: Negative for adenopathy. Does not bruise/bleed easily.  Psychiatric/Behavioral: Negative for dysphoric mood and sleep disturbance. The patient is not nervous/anxious.     Patient Active Problem List   Diagnosis Date Noted  . Migraine without aura and without status migrainosus, not intractable 09/12/2017  . Arthritis of ankle, right 02/23/2017  . S/P breast biopsy, left 10/11/2016  . Hyperlipidemia, mixed 08/24/2016  . History of difficulty in concentrating 05/30/2016  . Plantar fasciitis, bilateral 04/14/2016  . Iron deficiency 07/05/2015  . Vitamin D deficiency 11/24/2014  . Prediabetes 11/24/2014  . Essential (primary) hypertension 08/06/2014  . Obesity, Class III, BMI 40-49.9 (morbid obesity) (HCC) 08/06/2014    Prior to Admission medications   Medication Sig Start Date End Date Taking? Authorizing Provider  Cholecalciferol (VITAMIN D) 2000 units CAPS Take 1 capsule (2,000 Units total) by mouth daily. 07/05/15  Yes Plonk, Chrissie Noa, MD  ferrous sulfate 325 (65 FE) MG EC tablet Take 1 tablet (325 mg total) by mouth daily with breakfast. 07/05/15  Yes Plonk, Chrissie Noa, MD  lisinopril-hydrochlorothiazide (PRINZIDE,ZESTORETIC) 20-25 MG tablet Take 1 tablet by mouth daily. Patient taking differently: Take 0.5 tablets by mouth daily.  10/06/16  Yes Reubin Milan, MD    No Known  Allergies  Past Surgical History:  Procedure Laterality Date  . BREAST BIOPSY Left 10/09/2016   Afffirm Biopsy- path pending  . CESAREAN SECTION  03/04/2000  . CHOLECYSTECTOMY  2006  . TUBAL LIGATION  2006    Social History   Tobacco Use  . Smoking status: Never Smoker  . Smokeless tobacco: Never Used  Substance Use Topics  . Alcohol use: No    Alcohol/week: 0.0 oz  . Drug use: No     Medication list has been reviewed and updated.  PHQ 2/9 Scores 09/12/2017 08/23/2016  PHQ - 2 Score 0 0    Physical Exam  Constitutional: She is oriented to person, place, and time. She appears well-developed and well-nourished. No distress.  HENT:  Head: Normocephalic and atraumatic.  Right Ear: Tympanic membrane and ear canal normal.  Left Ear: Tympanic membrane and ear canal normal.  Nose: Right sinus exhibits no maxillary sinus tenderness. Left sinus exhibits no maxillary sinus tenderness.  Mouth/Throat: Uvula is midline and oropharynx is clear and moist.  Eyes: Conjunctivae and EOM are normal. Right eye exhibits no discharge. Left eye exhibits no discharge. No scleral icterus.  Neck: Normal range of motion. Carotid bruit is not present. No erythema present. No thyromegaly present.  Cardiovascular: Normal rate, regular rhythm, normal heart sounds and normal pulses.  Pulmonary/Chest: Effort normal. No respiratory distress. She has no wheezes. Right breast exhibits no mass, no nipple discharge, no skin change and no tenderness. Left breast exhibits no mass, no nipple discharge, no skin change and no tenderness.  Abdominal: Soft. Bowel sounds are normal. There is no hepatosplenomegaly. There is no tenderness. There is no CVA tenderness.  Musculoskeletal: Normal range of motion.  Lymphadenopathy:    She has no cervical adenopathy.    She has no axillary adenopathy.  Neurological: She is alert and oriented to person, place, and time. She has normal strength and normal reflexes. No cranial nerve  deficit or sensory deficit.  Skin: Skin is warm, dry and intact. No rash noted.  Psychiatric: She has a normal mood and affect. Her speech is normal and behavior is normal. Thought content normal.  Nursing note and vitals reviewed.   BP 122/84   Pulse 94   Resp 16   Ht  (1.499 m)   Wt 215 lb (97.5 kg)   LMP 08/18/2017 (Exact Date)   SpO2 97%   BMI 43.42 kg/m   Assessment and Plan: 1. Annual physical exam Normal exam except for weight Discussed diet changes and exercise - POCT urinalysis dipstick  2. Breast cancer screening - MM 3D SCREEN BREAST BILATERAL; Future  3. Obesity, Class III, BMI 40-49.9 (morbid obesity) (HCC) To begin changing diet and becoming more active  4. Essential (primary) hypertension controlled - CBC with Differential/Platelet - Comprehensive metabolic panel - lisinopril-hydrochlorothiazide (PRINZIDE,ZESTORETIC) 20-25 MG tablet; Take 1 tablet by mouth daily.  Dispense: 90 tablet; Refill: 3  5. Prediabetes Check labs - Hemoglobin A1c - TSH  6. Hyperlipidemia, mixed Not on statin therapy at this time - Lipid panel  7. Migraine without aura and without status migrainosus, not intractable Continue tylenol as needed   Meds ordered this encounter  Medications  . lisinopril-hydrochlorothiazide (PRINZIDE,ZESTORETIC) 20-25 MG tablet    Sig: Take 1 tablet by mouth daily.    Dispense:  90 tablet    Refill:  3    Partially dictated using Animal nutritionist. Any errors are  unintentional.  Bari Edward, MD [facility]  09/12/2017

## 2017-09-12 NOTE — Assessment & Plan Note (Signed)
Controlled on current therapy ?

## 2017-09-12 NOTE — Patient Instructions (Signed)

## 2017-09-13 LAB — CBC WITH DIFFERENTIAL/PLATELET
BASOS ABS: 0 10*3/uL (ref 0.0–0.2)
Basos: 0 %
EOS (ABSOLUTE): 0.2 10*3/uL (ref 0.0–0.4)
Eos: 3 %
HEMOGLOBIN: 14.3 g/dL (ref 11.1–15.9)
Hematocrit: 42.3 % (ref 34.0–46.6)
IMMATURE GRANS (ABS): 0 10*3/uL (ref 0.0–0.1)
Immature Granulocytes: 0 %
LYMPHS: 25 %
Lymphocytes Absolute: 1.7 10*3/uL (ref 0.7–3.1)
MCH: 29.6 pg (ref 26.6–33.0)
MCHC: 33.8 g/dL (ref 31.5–35.7)
MCV: 88 fL (ref 79–97)
Monocytes Absolute: 0.5 10*3/uL (ref 0.1–0.9)
Monocytes: 8 %
NEUTROS ABS: 4.4 10*3/uL (ref 1.4–7.0)
NEUTROS PCT: 64 %
PLATELETS: 263 10*3/uL (ref 150–450)
RBC: 4.83 x10E6/uL (ref 3.77–5.28)
RDW: 13.5 % (ref 12.3–15.4)
WBC: 6.8 10*3/uL (ref 3.4–10.8)

## 2017-09-13 LAB — LIPID PANEL
Chol/HDL Ratio: 4.4 ratio (ref 0.0–4.4)
Cholesterol, Total: 222 mg/dL — ABNORMAL HIGH (ref 100–199)
HDL: 50 mg/dL (ref >39)
LDL CALC: 132 mg/dL — AB (ref 0–99)
Triglycerides: 200 mg/dL — ABNORMAL HIGH (ref 0–149)
VLDL CHOLESTEROL CAL: 40 mg/dL (ref 5–40)

## 2017-09-13 LAB — COMPREHENSIVE METABOLIC PANEL
ALBUMIN: 4.4 g/dL (ref 3.5–5.5)
ALT: 13 IU/L (ref 0–32)
AST: 15 IU/L (ref 0–40)
Albumin/Globulin Ratio: 1.7 (ref 1.2–2.2)
Alkaline Phosphatase: 34 IU/L — ABNORMAL LOW (ref 39–117)
BILIRUBIN TOTAL: 0.6 mg/dL (ref 0.0–1.2)
BUN/Creatinine Ratio: 17 (ref 9–23)
BUN: 11 mg/dL (ref 6–24)
CHLORIDE: 101 mmol/L (ref 96–106)
CO2: 23 mmol/L (ref 20–29)
CREATININE: 0.63 mg/dL (ref 0.57–1.00)
Calcium: 9.6 mg/dL (ref 8.7–10.2)
GFR calc non Af Amer: 109 mL/min/{1.73_m2} (ref >59)
GFR, EST AFRICAN AMERICAN: 125 mL/min/{1.73_m2} (ref >59)
GLUCOSE: 166 mg/dL — AB (ref 65–99)
Globulin, Total: 2.6 g/dL (ref 1.5–4.5)
Potassium: 4.4 mmol/L (ref 3.5–5.2)
Sodium: 138 mmol/L (ref 134–144)
Total Protein: 7 g/dL (ref 6.0–8.5)

## 2017-09-13 LAB — TSH: TSH: 1.73 u[IU]/mL (ref 0.450–4.500)

## 2017-09-13 LAB — HEMOGLOBIN A1C
Est. average glucose Bld gHb Est-mCnc: 111 mg/dL
Hgb A1c MFr Bld: 5.5 % (ref 4.8–5.6)

## 2018-03-15 ENCOUNTER — Ambulatory Visit: Payer: Self-pay | Admitting: Internal Medicine

## 2018-05-04 IMAGING — MG MM BREAST BX W LOC DEV 1ST LESION IMAGE BX SPEC STEREO GUIDE*L*
8 of 13 series · 8 of 25 positions shown · non-contrast
Comparison: Previous exams.

ADDENDUM:
Pathology of the left breast biopsy revealed A. BREAST
CALCIFICATIONS, LEFT UPPER OUTER QUADRANT; STEREOTACTIC BIOPSY:
COLUMNAR CELL CHANGE WITH ASSOCIATED CALCIFICATIONS. NEGATIVE FOR
ATYPIA AND MALIGNANCY. This was found to be concordant with Dr.
Moatshe impression and notes.

Recommendation:  Return to routine screening mammogram in August 2017.
At the patient's request, results and recommendations were relayed
to the patient by phone. The patient stated she has done well
following the biopsy with no bleeding, bruising, or hematoma. At the
time of conversation, the patient stated she has some pain at the
site, but she is at work and thinks it is work related. Post biopsy
instructions were reviewed with the patient. All of her questions
were answered. She was encouraged to call the [HOSPITAL]
or Angeria, [facility] ([REDACTED]) with any further
questions or concerns.
Results and recommendations relayed by Angeria, [facility] on 10/10/16.
CLINICAL DATA: 45-year-old female for stereotactic guided biopsy of
indeterminate left breast calcifications
EXAM:
LEFT BREAST STEREOTACTIC CORE NEEDLE BIOPSY

[L (1 of 5)]
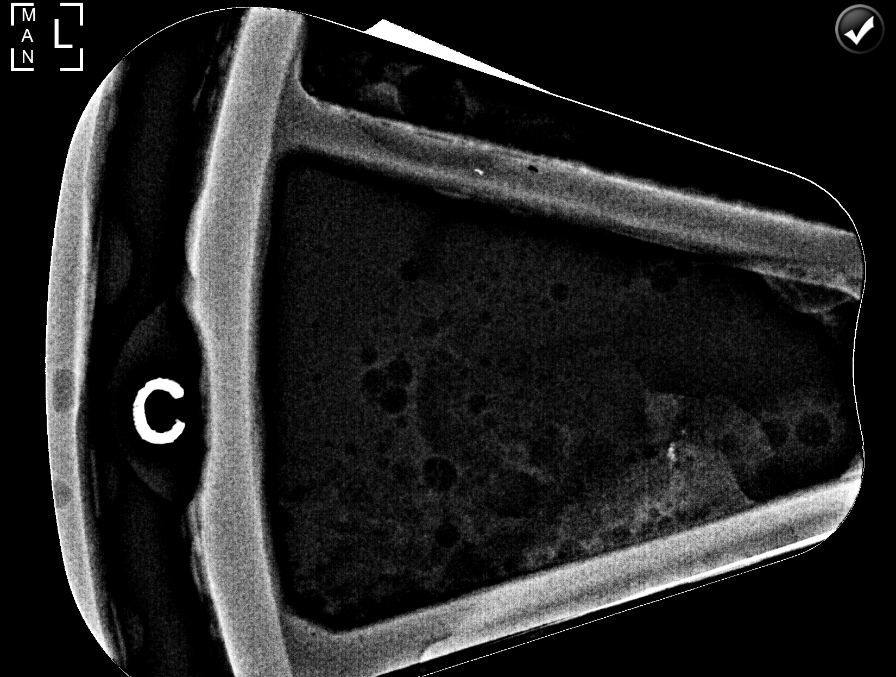

[L (2 of 5)]
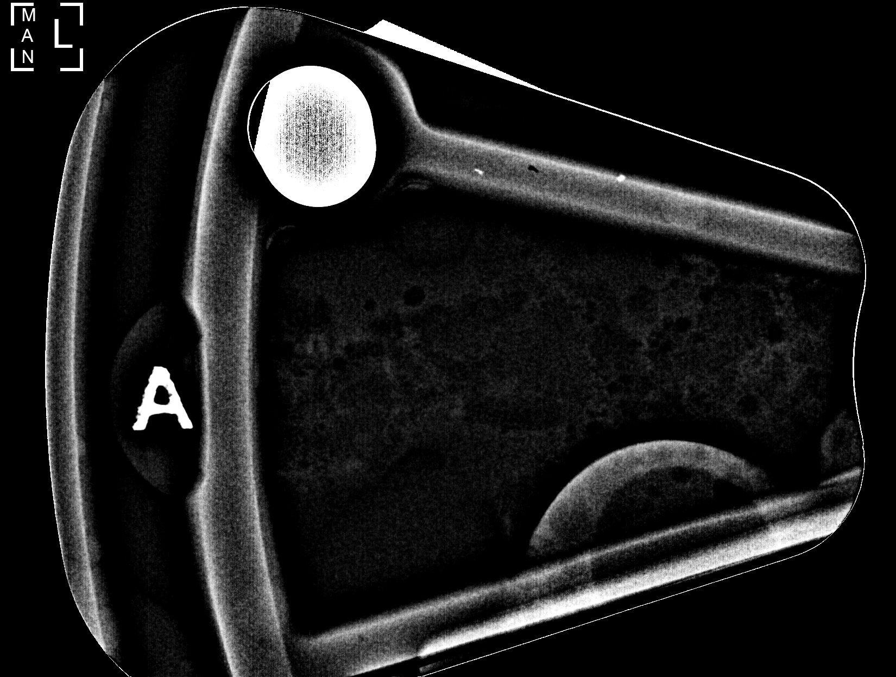

[L (3 of 5)]
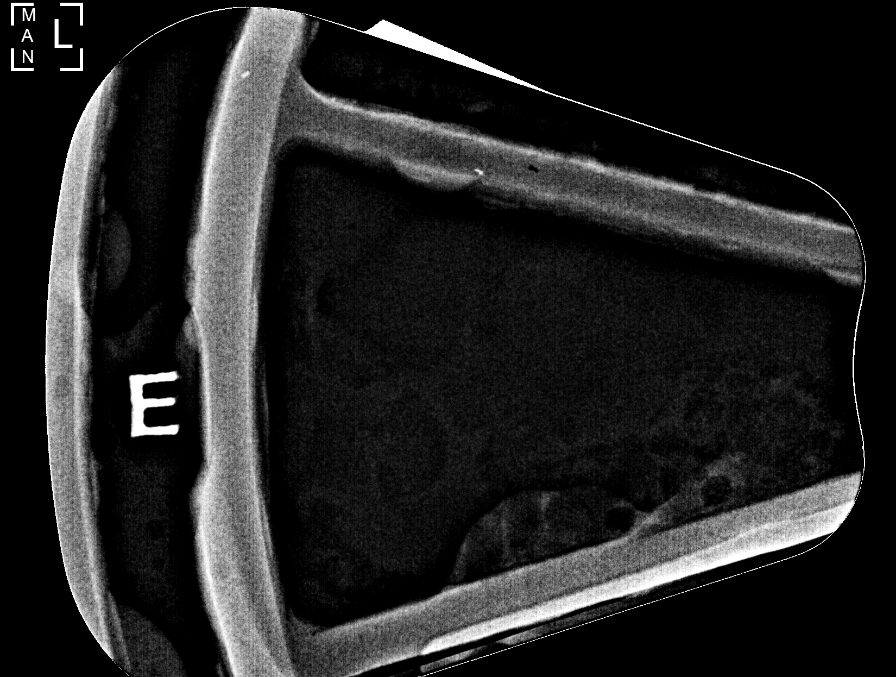

[L (4 of 5)]
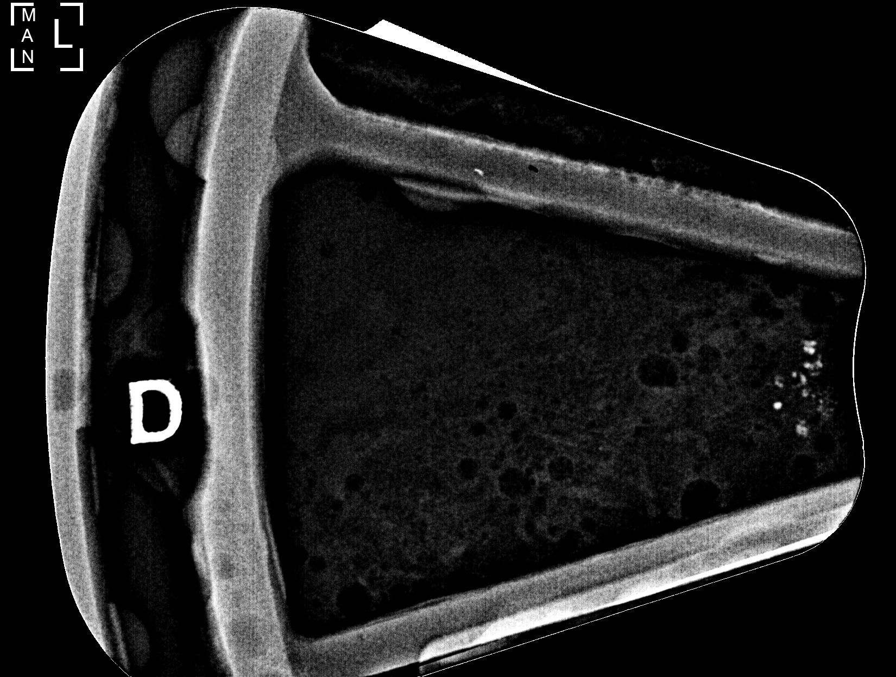

[L (5 of 5)]
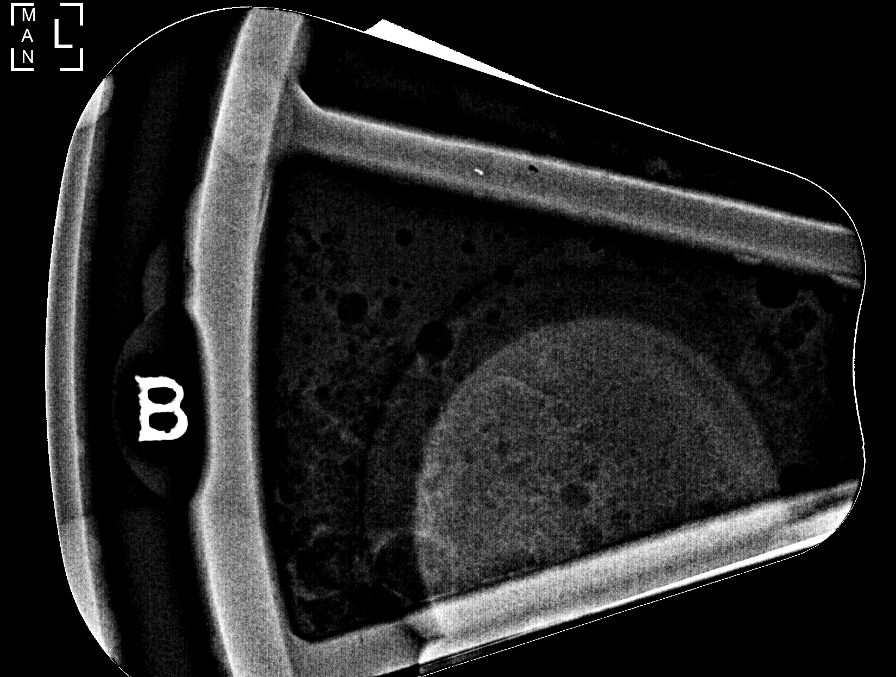

[L CC (1 of 3)]
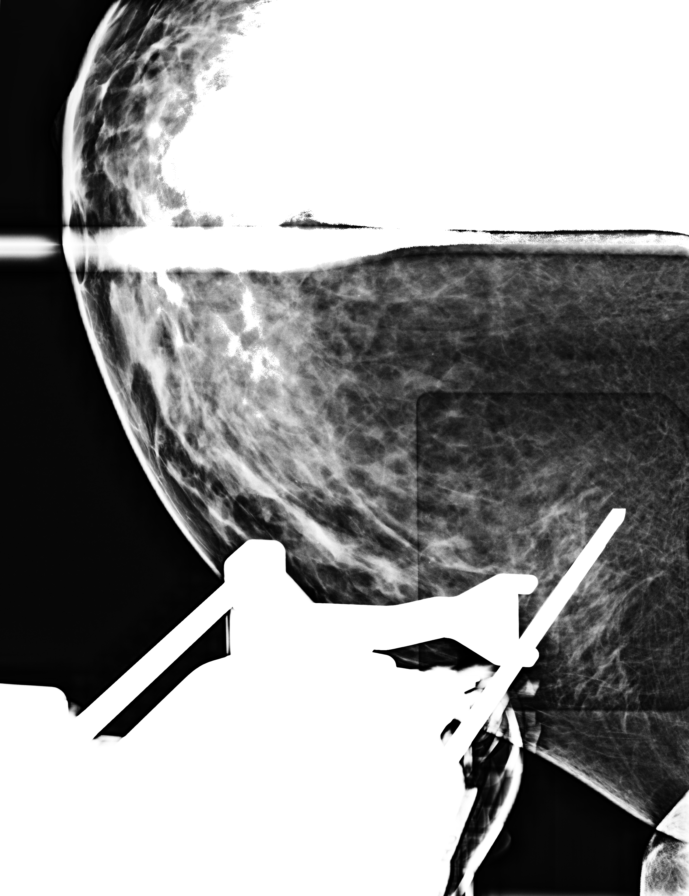

[L CC (2 of 3)]
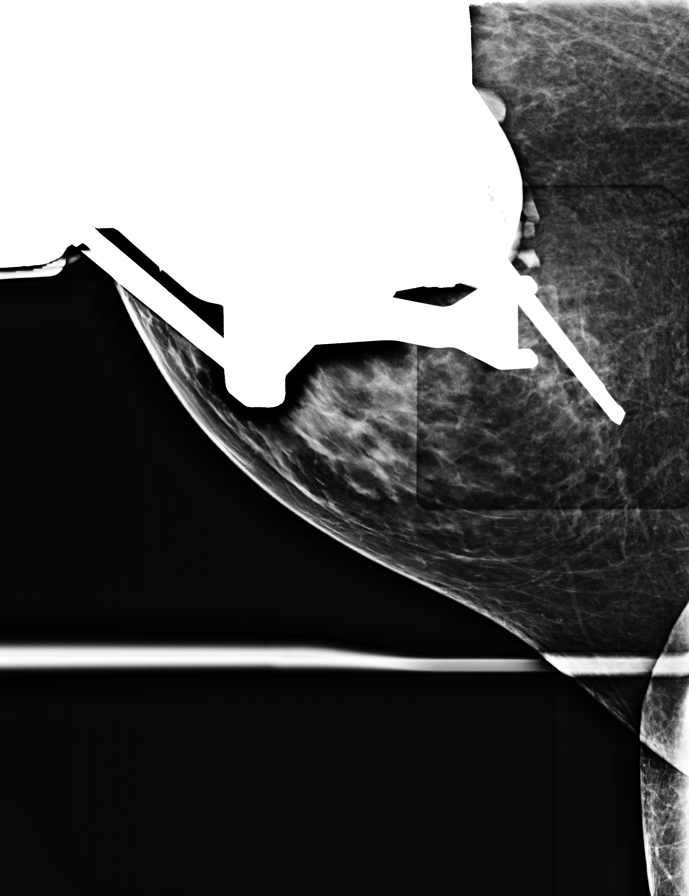

[L CC (3 of 3)]
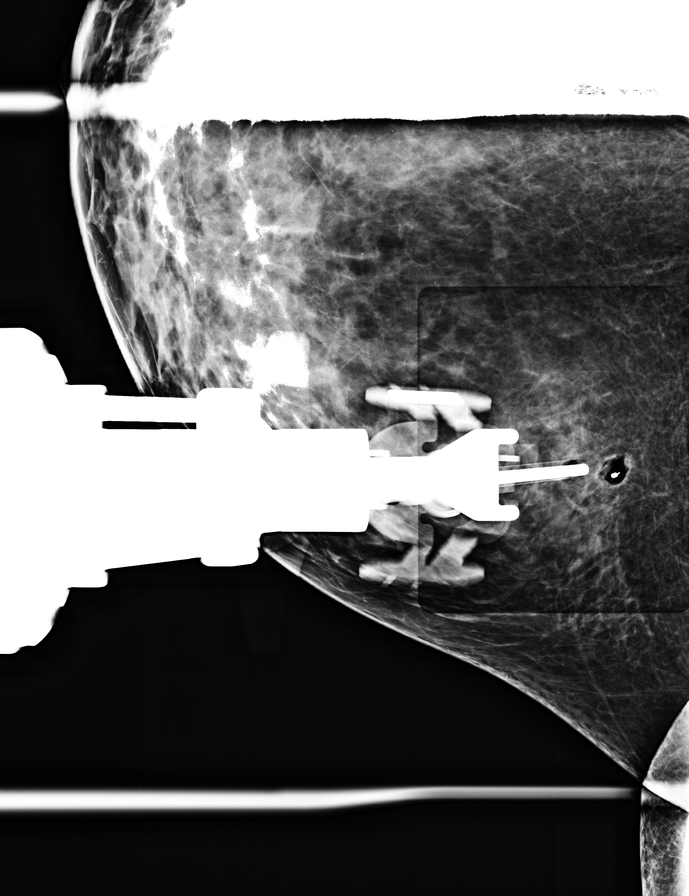

[8 of 25 positions shown; findings below may reference images not displayed]



Using sterile technique and 1% Lidocaine as local anesthetic, under
stereotactic guidance, a 9 gauge vacuum assisted device was used to
perform core needle biopsy of calcifications in the upper, outer
quadrant of the left breast using a superior to inferior approach.
Specimen radiograph was performed showing calcifications within the
specimen. Specimens with calcifications are identified for
pathology.

Lesion quadrant: Upper outer quadrant

At the conclusion of the procedure, a coil shaped tissue marker clip
was deployed into the biopsy cavity. Follow-up 2-view mammogram was
performed and dictated separately.
IMPRESSION: Stereotactic-guided biopsy of indeterminate left breast
calcifications. No apparent complications.

## 2018-07-19 ENCOUNTER — Other Ambulatory Visit: Payer: Self-pay

## 2018-07-19 ENCOUNTER — Ambulatory Visit
Admission: EM | Admit: 2018-07-19 | Discharge: 2018-07-19 | Disposition: A | Discharge status: Home / Self Care | Payer: Self-pay | Attending: Emergency Medicine | Admitting: Emergency Medicine

## 2018-07-19 DIAGNOSIS — R1013 Epigastric pain: Secondary | ICD-10-CM | POA: Insufficient documentation

## 2018-07-19 LAB — CBC WITH DIFFERENTIAL/PLATELET
Abs Immature Granulocytes: 0.06 10*3/uL (ref 0.00–0.07)
BASOS ABS: 0 10*3/uL (ref 0.0–0.1)
Basophils Relative: 0 %
EOS PCT: 2 %
Eosinophils Absolute: 0.2 10*3/uL (ref 0.0–0.5)
HCT: 44.3 % (ref 36.0–46.0)
HEMOGLOBIN: 14.5 g/dL (ref 12.0–15.0)
Immature Granulocytes: 1 %
LYMPHS PCT: 29 %
Lymphs Abs: 2.7 10*3/uL (ref 0.7–4.0)
MCH: 30.3 pg (ref 26.0–34.0)
MCHC: 32.7 g/dL (ref 30.0–36.0)
MCV: 92.5 fL (ref 80.0–100.0)
Monocytes Absolute: 0.8 10*3/uL (ref 0.1–1.0)
Monocytes Relative: 8 %
Neutro Abs: 5.6 10*3/uL (ref 1.7–7.7)
Neutrophils Relative %: 60 %
PLATELETS: 263 10*3/uL (ref 150–400)
RBC: 4.79 MIL/uL (ref 3.87–5.11)
RDW: 12.6 % (ref 11.5–15.5)
WBC: 9.3 10*3/uL (ref 4.0–10.5)
nRBC: 0 % (ref 0.0–0.2)

## 2018-07-19 LAB — COMPREHENSIVE METABOLIC PANEL
ALT: 16 U/L (ref 0–44)
AST: 20 U/L (ref 15–41)
Albumin: 4 g/dL (ref 3.5–5.0)
Alkaline Phosphatase: 31 U/L — ABNORMAL LOW (ref 38–126)
Anion gap: 11 (ref 5–15)
BUN: 10 mg/dL (ref 6–20)
CHLORIDE: 103 mmol/L (ref 98–111)
CO2: 22 mmol/L (ref 22–32)
CREATININE: 0.59 mg/dL (ref 0.44–1.00)
Calcium: 9.1 mg/dL (ref 8.9–10.3)
Glucose, Bld: 105 mg/dL — ABNORMAL HIGH (ref 70–99)
POTASSIUM: 3.6 mmol/L (ref 3.5–5.1)
Sodium: 136 mmol/L (ref 135–145)
Total Bilirubin: 0.4 mg/dL (ref 0.3–1.2)
Total Protein: 7.8 g/dL (ref 6.5–8.1)

## 2018-07-19 LAB — LIPASE, BLOOD: Lipase: 27 U/L (ref 11–51)

## 2018-07-19 LAB — TROPONIN I

## 2018-07-19 MED ORDER — FAMOTIDINE 20 MG PO TABS: 20.0000 mg | ORAL_TABLET | Freq: Two times a day (BID) | ORAL | 0 refills | Status: DC

## 2018-07-19 NOTE — ED Triage Notes (Signed)
Pt here for abdominal pain that started last night and did take tylenol. Hurts when she walks around and radiates to her back. States her stomach feels uneasy and in the upper middle region as if she has done "too many sit ups" no reports of nausea, vomiting, diarrhea or constipation.

## 2018-07-19 NOTE — ED Provider Notes (Signed)
HPI  SUBJECTIVE:  Nicole Vargas is a 48 y.o. female who presents with epigastric abdominal pain described as "soreness" starting last night after eating dinner.  States that it radiates around to her back.  It is intermittent, present only with walking.  She states that she ate a lot of foods that "do not agree with me" yesterday which included greasy foods.  No nausea, vomiting, fevers, abdominal distention, diarrhea.  No chest pain, shortness of breath. it was not radiate up her neck or down her arm.  She had a normal bowel movement immediately prior to arrival.  No urinary complaints.  No anorexia.  She has never had symptoms like this before.  No antipyretic in the past 4 to 6 hours.  She tried Tylenol, sitting still which helps her symptoms.  She also states that lying down helps.  Symptoms are worse with walking.  It is not associated with eating, drinking, urination, defecation.  She states that the car ride over here was not painful.  She denies any alcohol use, states that she is not a drinker.  No NSAID use.  She is status post lap chole, C-section, bilateral tubal ligation hypertension.  No history of gastritis, peptic ulcer disease, coronary disease, hypercholesterolemia, MI, pancreatitis.  LMP: 3 weeks ago.  Denies the possibility being pregnant.  Family history significant for grandfather with MI at age 57.  XLK:GMWNUUVO, Nyoka Cowden, MD   Past Medical History:  Diagnosis Date  . Abscess   . Hypertension   . Migraine     Past Surgical History:  Procedure Laterality Date  . BREAST BIOPSY Left 10/09/2016   Afffirm Biopsy- path pending  . CESAREAN SECTION  03/04/2000  . CHOLECYSTECTOMY  2006  . TUBAL LIGATION  2006    Family History  Problem Relation Age of Onset  . Diabetes Maternal Grandmother   . Heart disease Paternal Grandfather   . Breast cancer Maternal Aunt 33       aunt and great aunt    Social History   Tobacco Use  . Smoking status: Never Smoker  .  Smokeless tobacco: Never Used  Substance Use Topics  . Alcohol use: No    Alcohol/week: 0.0 standard drinks  . Drug use: No    No current facility-administered medications for this encounter.   Current Outpatient Medications:  .  Cholecalciferol (VITAMIN D) 2000 units CAPS, Take 1 capsule (2,000 Units total) by mouth daily., Disp: 30 capsule, Rfl:  .  ferrous sulfate 325 (65 FE) MG EC tablet, Take 1 tablet (325 mg total) by mouth daily with breakfast., Disp: 30 tablet, Rfl: 2 .  lisinopril-hydrochlorothiazide (PRINZIDE,ZESTORETIC) 20-25 MG tablet, Take 1 tablet by mouth daily., Disp: 90 tablet, Rfl: 3 .  famotidine (PEPCID) 20 MG tablet, Take 1 tablet (20 mg total) by mouth 2 (two) times daily., Disp: 40 tablet, Rfl: 0  Allergies  Allergen Reactions  . Latex Rash     ROS  As noted in HPI.   Physical Exam  BP (!) 130/97 (BP Location: Right Arm)   Pulse 89   Temp 97.8 F (36.6 C) (Oral)   Resp 18   Ht  (1.499 m)   Wt 99.8 kg   LMP 06/20/2018 (Exact Date)   SpO2 99%   BMI 44.43 kg/m   Constitutional: Well developed, well nourished, no acute distress moving around the room comfortably.   Eyes:  EOMI, conjunctiva normal bilaterally HENT: Normocephalic, atraumatic,mucus membranes moist Respiratory: Normal inspiratory effort lungs clear  bilaterally, good air movement  cardiovascular: Normal rate, regular rhythm, no murmurs rubs or gallops  GI: Lap chole and low transverse C-section scars.  No appreciable surgical or umbilical hernias.  Tenderness in the right upper quadrant and epigastric region.  No guarding, rebound.  No distention.  Active bowel sounds.  Negative McBurney. Back: No CVAT. skin: No rash, skin intact Musculoskeletal: no deformities Neurologic: Alert & oriented x 3, no focal neuro deficits Psychiatric: Speech and behavior appropriate   ED Course   Medications - No data to display  Orders Placed This Encounter  Procedures  . Comprehensive  metabolic panel    Standing Status:   Standing    Number of Occurrences:   1  . CBC with Differential    Standing Status:   Standing    Number of Occurrences:   1  . Lipase, blood    Standing Status:   Standing    Number of Occurrences:   1  . Troponin I - ONCE - STAT    Standing Status:   Standing    Number of Occurrences:   1  . ED EKG    Epigastric pain    Standing Status:   Standing    Number of Occurrences:   1    Order Specific Question:   Reason for Exam    Answer:   Other (See Comments)  . EKG 12-Lead    Standing Status:   Standing    Number of Occurrences:   1    Results for orders placed or performed during the hospital encounter of 07/19/18 (from the past 24 hour(s))  Comprehensive metabolic panel     Status: Abnormal   Collection Time: 07/19/18  8:18 PM  Result Value Ref Range   Sodium 136 135 - 145 mmol/L   Potassium 3.6 3.5 - 5.1 mmol/L   Chloride 103 98 - 111 mmol/L   CO2 22 22 - 32 mmol/L   Glucose, Bld 105 (H) 70 - 99 mg/dL   BUN 10 6 - 20 mg/dL   Creatinine, Ser 6.72 0.44 - 1.00 mg/dL   Calcium 9.1 8.9 - 09.4 mg/dL   Total Protein 7.8 6.5 - 8.1 g/dL   Albumin 4.0 3.5 - 5.0 g/dL   AST 20 15 - 41 U/L   ALT 16 0 - 44 U/L   Alkaline Phosphatase 31 (L) 38 - 126 U/L   Total Bilirubin 0.4 0.3 - 1.2 mg/dL   GFR calc non Af Amer >60 >60 mL/min   GFR calc Af Amer >60 >60 mL/min   Anion gap 11 5 - 15  CBC with Differential     Status: None   Collection Time: 07/19/18  8:18 PM  Result Value Ref Range   WBC 9.3 4.0 - 10.5 K/uL   RBC 4.79 3.87 - 5.11 MIL/uL   Hemoglobin 14.5 12.0 - 15.0 g/dL   HCT 70.9 62.8 - 36.6 %   MCV 92.5 80.0 - 100.0 fL   MCH 30.3 26.0 - 34.0 pg   MCHC 32.7 30.0 - 36.0 g/dL   RDW 29.4 76.5 - 46.5 %   Platelets 263 150 - 400 K/uL   nRBC 0.0 0.0 - 0.2 %   Neutrophils Relative % 60 %   Neutro Abs 5.6 1.7 - 7.7 K/uL   Lymphocytes Relative 29 %   Lymphs Abs 2.7 0.7 - 4.0 K/uL   Monocytes Relative 8 %   Monocytes Absolute 0.8 0.1 -  1.0 K/uL   Eosinophils  Relative 2 %   Eosinophils Absolute 0.2 0.0 - 0.5 K/uL   Basophils Relative 0 %   Basophils Absolute 0.0 0.0 - 0.1 K/uL   Immature Granulocytes 1 %   Abs Immature Granulocytes 0.06 0.00 - 0.07 K/uL  Lipase, blood     Status: None   Collection Time: 07/19/18  8:18 PM  Result Value Ref Range   Lipase 27 11 - 51 U/L  Troponin I - ONCE - STAT     Status: None   Collection Time: 07/19/18  8:18 PM  Result Value Ref Range   Troponin I <0.03 <0.03 ng/mL   No results found.  ED Clinical Impression  Epigastric pain   ED Assessment/Plan  EKG: Sinus tachycardia, rate 102.  Normal axis, normal intervals.  No hypertrophy.  No ST-T wave changes.  No previous EKG for comparison.   Troponin, CMP, lipase, CBC normal.  Suspect gastritis versus peptic ulcer disease, versus nonspecific abdominal pain.  No evidence of a surgical abdomen at this time.  Doubt cardiac etiology as her EKG is reassuring and troponin is negative.  Will send home with some Pepcid, have her follow-up with her primary care physician in several days, gave her strict ER return precautions.  Discussed labs,  MDM, treatment plan, and plan for follow-up with patient. Discussed sn/sx that should prompt return to the ED. patient agrees with plan.   Meds ordered this encounter  Medications  . famotidine (PEPCID) 20 MG tablet    Sig: Take 1 tablet (20 mg total) by mouth 2 (two) times daily.    Dispense:  40 tablet    Refill:  0    *This clinic note was created using Scientist, clinical (histocompatibility and immunogenetics). Therefore, there may be occasional mistakes despite careful proofreading.   ?   Domenick Gong, MD 07/20/18 [phone]

## 2018-07-19 NOTE — Discharge Instructions (Addendum)
Try the Pepcid.  You may take Tylenol gram 3 or 4 times a day as needed for pain.  Your labs and EKG are normal.  Go immediately to the ER if you start having chest pain, chest pressure, shortness of breath, if this abdominal pain is not controlled with the Tylenol, and inability to keep anything down, no urine output for 12 hours, or for any other concerns.

## 2018-08-29 ENCOUNTER — Other Ambulatory Visit: Payer: Self-pay

## 2018-08-29 ENCOUNTER — Ambulatory Visit (INDEPENDENT_AMBULATORY_CARE_PROVIDER_SITE_OTHER): Payer: Self-pay | Admitting: Internal Medicine

## 2018-08-29 ENCOUNTER — Encounter: Payer: Self-pay | Admitting: Internal Medicine

## 2018-08-29 VITALS — BP 128/80 | HR 90 | Ht 59.0 in | Wt 240.0 lb

## 2018-08-29 DIAGNOSIS — N951 Menopausal and female climacteric states: Secondary | ICD-10-CM

## 2018-08-29 DIAGNOSIS — I10 Essential (primary) hypertension: Secondary | ICD-10-CM

## 2018-08-29 LAB — POCT URINE PREGNANCY: Preg Test, Ur: NEGATIVE

## 2018-08-29 MED ORDER — LISINOPRIL-HYDROCHLOROTHIAZIDE 20-25 MG PO TABS: 1.0000 | ORAL_TABLET | Freq: Every day | ORAL | 3 refills | Status: DC

## 2018-08-29 NOTE — Progress Notes (Signed)
Date:  08/29/2018   Name:  Nicole Vargas   DOB:  January 23, 1971   MRN:  [id number]   Chief Complaint: Amenorrhea (Patient said her period is one week late. )  Late Menses - pt states menses have always been regular, every 28 days since onset age 48.  She was due to start one week ago but has not.  There has been no pain, spotting, breast tenderness or any stressors.  She has noticed some hot flashes during the day and sweats at night.  She is s/p tubal ligation 14 years ago.  Hypertension  This is a chronic problem. The problem is controlled. Pertinent negatives include no chest pain, headaches or shortness of breath. Past treatments include ACE inhibitors and diuretics.    Review of Systems  Constitutional: Positive for diaphoresis. Negative for chills, fatigue and fever.  Eyes: Negative for visual disturbance.  Respiratory: Negative for shortness of breath and wheezing.   Cardiovascular: Negative for chest pain and leg swelling.  Genitourinary: Positive for menstrual problem (no menses for one week). Negative for frequency, pelvic pain and vaginal discharge.  Allergic/Immunologic: Negative for environmental allergies.  Neurological: Negative for dizziness, weakness, light-headedness and headaches.  Psychiatric/Behavioral: Negative for sleep disturbance.    Patient Active Problem List   Diagnosis Date Noted  . Migraine without aura and without status migrainosus, not intractable 09/12/2017  . Arthritis of ankle, right 02/23/2017  . S/P breast biopsy, left 10/11/2016  . Hyperlipidemia, mixed 08/24/2016  . History of difficulty in concentrating 05/30/2016  . Plantar fasciitis, bilateral 04/14/2016  . Iron deficiency 07/05/2015  . Vitamin D deficiency 11/24/2014  . Prediabetes 11/24/2014  . Essential (primary) hypertension 08/06/2014  . Obesity, Class III, BMI 40-49.9 (morbid obesity) (HCC) 08/06/2014    Allergies  Allergen Reactions  . Latex Rash    Past Surgical  History:  Procedure Laterality Date  . BREAST BIOPSY Left 10/09/2016   Afffirm Biopsy- path pending  . CESAREAN SECTION  03/04/2000  . CHOLECYSTECTOMY  2006  . TUBAL LIGATION  2006    Social History   Tobacco Use  . Smoking status: Never Smoker  . Smokeless tobacco: Never Used  Substance Use Topics  . Alcohol use: No    Alcohol/week: 0.0 standard drinks  . Drug use: No     Medication list has been reviewed and updated.  Current Meds  Medication Sig  . Cholecalciferol (VITAMIN D) 2000 units CAPS Take 1 capsule (2,000 Units total) by mouth daily.  . famotidine (PEPCID) 20 MG tablet Take 1 tablet (20 mg total) by mouth 2 (two) times daily.  . ferrous sulfate 325 (65 FE) MG EC tablet Take 1 tablet (325 mg total) by mouth daily with breakfast.  . lisinopril-hydrochlorothiazide (ZESTORETIC) 20-25 MG tablet Take 1 tablet by mouth daily.  . [DISCONTINUED] lisinopril-hydrochlorothiazide (PRINZIDE,ZESTORETIC) 20-25 MG tablet Take 1 tablet by mouth daily.    PHQ 2/9 Scores 08/29/2018 09/12/2017 08/23/2016  PHQ - 2 Score 0 0 0    BP Readings from Last 3 Encounters:  08/29/18 128/80  07/19/18 (!) 130/97  09/12/17 122/84    Physical Exam Vitals signs and nursing note reviewed.  Constitutional:      General: She is not in acute distress.    Appearance: She is well-developed.  HENT:     Head: Normocephalic and atraumatic.  Neck:     Musculoskeletal: Normal range of motion and neck supple.  Cardiovascular:     Rate and Rhythm: Normal rate and  regular rhythm.     Pulses: Normal pulses.  Pulmonary:     Effort: Pulmonary effort is normal. No respiratory distress.  Abdominal:     General: Bowel sounds are normal.     Palpations: Abdomen is soft. There is no mass.     Tenderness: There is no abdominal tenderness. There is no guarding.  Musculoskeletal: Normal range of motion.  Lymphadenopathy:     Cervical: No cervical adenopathy.  Skin:    General: Skin is warm and dry.      Findings: No rash.  Neurological:     Mental Status: She is alert and oriented to person, place, and time.  Psychiatric:        Behavior: Behavior normal.        Thought Content: Thought content normal.    Urine pregnancy test: negative  Wt Readings from Last 3 Encounters:  08/29/18 240 lb (108.9 kg)  07/19/18 220 lb (99.8 kg)  09/12/17 215 lb (97.5 kg)    BP 128/80   Pulse 90   Ht 4\' 11"  (1.499 m)   Wt 240 lb (108.9 kg) Comment: steel toe boots on  LMP 07/24/2018 (Exact Date)   SpO2 97%   BMI 48.47 kg/m   Assessment and Plan: 1. Peri-menopausal Pt reassured; s/s discussed Pt will return if further concerns - POCT urine pregnancy  2. Essential (primary) hypertension Controlled; continue ACE and HCTZ - lisinopril-hydrochlorothiazide (ZESTORETIC) 20-25 MG tablet; Take 1 tablet by mouth daily.  Dispense: 90 tablet; Refill: 3   Partially dictated using Animal nutritionistDragon software. Any errors are unintentional.  Bari EdwardLaura Berglund, MD [facility]  08/29/2018

## 2018-09-18 ENCOUNTER — Encounter: Payer: Self-pay | Admitting: Internal Medicine

## 2018-09-18 ENCOUNTER — Other Ambulatory Visit: Payer: Self-pay

## 2018-09-18 ENCOUNTER — Ambulatory Visit (INDEPENDENT_AMBULATORY_CARE_PROVIDER_SITE_OTHER): Payer: Self-pay | Admitting: Internal Medicine

## 2018-09-18 VITALS — BP 124/78 | HR 89 | Ht 59.0 in | Wt 237.0 lb

## 2018-09-18 DIAGNOSIS — Z1231 Encounter for screening mammogram for malignant neoplasm of breast: Secondary | ICD-10-CM

## 2018-09-18 DIAGNOSIS — I10 Essential (primary) hypertension: Secondary | ICD-10-CM

## 2018-09-18 DIAGNOSIS — E559 Vitamin D deficiency, unspecified: Secondary | ICD-10-CM

## 2018-09-18 DIAGNOSIS — Z0000 Encounter for general adult medical examination without abnormal findings: Secondary | ICD-10-CM

## 2018-09-18 DIAGNOSIS — E782 Mixed hyperlipidemia: Secondary | ICD-10-CM

## 2018-09-18 DIAGNOSIS — R7303 Prediabetes: Secondary | ICD-10-CM

## 2018-09-18 LAB — POCT URINALYSIS DIPSTICK
Bilirubin, UA: NEGATIVE
Blood, UA: NEGATIVE
Glucose, UA: NEGATIVE
Ketones, UA: NEGATIVE
Leukocytes, UA: NEGATIVE
Nitrite, UA: NEGATIVE
Protein, UA: NEGATIVE
Spec Grav, UA: 1.015 (ref 1.010–1.025)
Urobilinogen, UA: 0.2 E.U./dL
pH, UA: 6.5 (ref 5.0–8.0)

## 2018-09-18 NOTE — Progress Notes (Signed)
Date:  09/18/2018   Name:  Nicole Vargas   DOB:  1970/11/28   MRN:  [id number]   Chief Complaint: Annual Exam (Breast exam. 3 more years til pap.) Nicole Vargas is a 48 y.o. female who presents today for her Complete Annual Exam. She feels well. She reports exercising walking. She reports she is sleeping well. Since her last visit she did have a menstrual cycle.  Pap smear 2018 - neg with neg HPV Mammogram 2018  Hypertension  The problem is controlled. Pertinent negatives include no chest pain, headaches, palpitations or shortness of breath. Past treatments include ACE inhibitors and diuretics. The current treatment provides significant improvement. There are no compliance problems.   Diabetes  She presents for her follow-up diabetic visit. Diabetes type: pre-diabetes. Pertinent negatives for hypoglycemia include no dizziness, headaches, nervousness/anxiousness or tremors. Pertinent negatives for diabetes include no chest pain, no fatigue, no polydipsia and no polyuria.   Lab Results  Component Value Date   CREATININE 0.59 07/19/2018   BUN 10 07/19/2018   NA 136 07/19/2018   K 3.6 07/19/2018   CL 103 07/19/2018   CO2 22 07/19/2018   Lab Results  Component Value Date   HGBA1C 5.5 09/12/2017   Lab Results  Component Value Date   CHOL 222 (H) 09/12/2017   HDL 50 09/12/2017   LDLCALC 132 (H) 09/12/2017   TRIG 200 (H) 09/12/2017   CHOLHDL 4.4 09/12/2017   Lab Results  Component Value Date   TSH 1.730 09/12/2017     Review of Systems  Constitutional: Negative for chills, fatigue and fever.  HENT: Negative for congestion, hearing loss, tinnitus, trouble swallowing and voice change.   Eyes: Negative for visual disturbance.  Respiratory: Negative for cough, chest tightness, shortness of breath and wheezing.   Cardiovascular: Negative for chest pain, palpitations and leg swelling.  Gastrointestinal: Negative for abdominal pain, constipation, diarrhea and  vomiting.  Endocrine: Negative for polydipsia and polyuria.  Genitourinary: Negative for dysuria, frequency, genital sores, vaginal bleeding and vaginal discharge.  Musculoskeletal: Negative for arthralgias, gait problem and joint swelling.  Skin: Negative for color change and rash.  Neurological: Negative for dizziness, tremors, light-headedness and headaches.  Hematological: Negative for adenopathy. Does not bruise/bleed easily.  Psychiatric/Behavioral: Negative for dysphoric mood and sleep disturbance. The patient is not nervous/anxious.     Patient Active Problem List   Diagnosis Date Noted  . Migraine without aura and without status migrainosus, not intractable 09/12/2017  . Arthritis of ankle, right 02/23/2017  . S/P breast biopsy, left 10/11/2016  . Hyperlipidemia, mixed 08/24/2016  . History of difficulty in concentrating 05/30/2016  . Plantar fasciitis, bilateral 04/14/2016  . Iron deficiency 07/05/2015  . Vitamin D deficiency 11/24/2014  . Prediabetes 11/24/2014  . Essential (primary) hypertension 08/06/2014  . Obesity, Class III, BMI 40-49.9 (morbid obesity) (HCC) 08/06/2014    Allergies  Allergen Reactions  . Latex Rash    Past Surgical History:  Procedure Laterality Date  . BREAST BIOPSY Left 10/09/2016   Afffirm Biopsy- path pending  . CESAREAN SECTION  03/04/2000  . CHOLECYSTECTOMY  2006  . TUBAL LIGATION  2006    Social History   Tobacco Use  . Smoking status: Never Smoker  . Smokeless tobacco: Never Used  Substance Use Topics  . Alcohol use: No    Alcohol/week: 0.0 standard drinks  . Drug use: No     Medication list has been reviewed and updated.  Current Meds  Medication Sig  .  Cholecalciferol (VITAMIN D) 2000 units CAPS Take 1 capsule (2,000 Units total) by mouth daily.  . famotidine (PEPCID) 20 MG tablet Take 1 tablet (20 mg total) by mouth 2 (two) times daily.  . ferrous sulfate 325 (65 FE) MG EC tablet Take 1 tablet (325 mg total) by  mouth daily with breakfast.  . lisinopril-hydrochlorothiazide (ZESTORETIC) 20-25 MG tablet Take 1 tablet by mouth daily.    PHQ 2/9 Scores 09/18/2018 08/29/2018 09/12/2017 08/23/2016  PHQ - 2 Score 0 0 0 0    BP Readings from Last 3 Encounters:  09/18/18 124/78  08/29/18 128/80  07/19/18 (!) 130/97    Physical Exam Vitals signs and nursing note reviewed.  Constitutional:      General: She is not in acute distress.    Appearance: She is well-developed.  HENT:     Head: Normocephalic and atraumatic.     Right Ear: Tympanic membrane and ear canal normal.     Left Ear: Tympanic membrane and ear canal normal.     Nose:     Right Sinus: No maxillary sinus tenderness.     Left Sinus: No maxillary sinus tenderness.     Mouth/Throat:     Pharynx: Uvula midline.  Eyes:     General: No scleral icterus.       Right eye: No discharge.        Left eye: No discharge.     Conjunctiva/sclera: Conjunctivae normal.  Neck:     Musculoskeletal: Normal range of motion. No erythema.     Thyroid: No thyromegaly.     Vascular: No carotid bruit.  Cardiovascular:     Rate and Rhythm: Normal rate and regular rhythm.     Pulses: Normal pulses.     Heart sounds: Normal heart sounds.  Pulmonary:     Effort: Pulmonary effort is normal. No respiratory distress.     Breath sounds: No wheezing.  Chest:     Breasts:        Right: No mass, nipple discharge, skin change or tenderness.        Left: No mass, nipple discharge, skin change or tenderness.  Abdominal:     General: Bowel sounds are normal.     Palpations: Abdomen is soft.     Tenderness: There is no abdominal tenderness.  Musculoskeletal: Normal range of motion.  Lymphadenopathy:     Cervical: No cervical adenopathy.  Skin:    General: Skin is warm and dry.     Findings: No rash.  Neurological:     Mental Status: She is alert and oriented to person, place, and time.     Cranial Nerves: No cranial nerve deficit.     Sensory: No sensory  deficit.     Deep Tendon Reflexes: Reflexes are normal and symmetric.  Psychiatric:        Speech: Speech normal.        Behavior: Behavior normal.        Thought Content: Thought content normal.     Wt Readings from Last 3 Encounters:  09/18/18 237 lb (107.5 kg)  08/29/18 240 lb (108.9 kg)  07/19/18 220 lb (99.8 kg)    BP 124/78   Pulse 89   Ht 4\' 11"  (1.499 m)   Wt 237 lb (107.5 kg)   SpO2 96%   BMI 47.87 kg/m   Assessment and Plan: 1. Annual physical exam Continue to work on diet and weight loss - POCT urinalysis dipstick  2. Encounter for screening mammogram  for breast cancer Inquire about free mammogram at [facility] - MM 3D SCREEN BREAST BILATERAL; Future  3. Essential (primary) hypertension controlled - CBC with Differential/Platelet - Comprehensive metabolic panel - TSH  4. Hyperlipidemia, mixed Check labs - Lipid panel  5. Prediabetes Continue diet and weight loss efforts - Hemoglobin A1c  6. Vitamin D deficiency Continue daily supplement   Partially dictated using Animal nutritionist. Any errors are unintentional.  Bari Edward, MD [facility]  09/18/2018

## 2018-09-19 LAB — COMPREHENSIVE METABOLIC PANEL
ALT: 19 IU/L (ref 0–32)
AST: 21 IU/L (ref 0–40)
Albumin/Globulin Ratio: 1.7 (ref 1.2–2.2)
Albumin: 4.3 g/dL (ref 3.8–4.8)
Alkaline Phosphatase: 34 IU/L — ABNORMAL LOW (ref 39–117)
BUN/Creatinine Ratio: 20 (ref 9–23)
BUN: 13 mg/dL (ref 6–24)
Bilirubin Total: 0.3 mg/dL (ref 0.0–1.2)
CO2: 23 mmol/L (ref 20–29)
Calcium: 9.4 mg/dL (ref 8.7–10.2)
Chloride: 100 mmol/L (ref 96–106)
Creatinine, Ser: 0.66 mg/dL (ref 0.57–1.00)
GFR calc Af Amer: 123 mL/min/{1.73_m2} (ref >59)
GFR calc non Af Amer: 106 mL/min/{1.73_m2} (ref >59)
Globulin, Total: 2.6 g/dL (ref 1.5–4.5)
Glucose: 91 mg/dL (ref 65–99)
Potassium: 4.8 mmol/L (ref 3.5–5.2)
Sodium: 141 mmol/L (ref 134–144)
Total Protein: 6.9 g/dL (ref 6.0–8.5)

## 2018-09-19 LAB — CBC WITH DIFFERENTIAL/PLATELET
Basophils Absolute: 0 10*3/uL (ref 0.0–0.2)
Basos: 1 %
EOS (ABSOLUTE): 0.2 10*3/uL (ref 0.0–0.4)
Eos: 3 %
Hematocrit: 43.1 % (ref 34.0–46.6)
Hemoglobin: 14.2 g/dL (ref 11.1–15.9)
Immature Grans (Abs): 0 10*3/uL (ref 0.0–0.1)
Immature Granulocytes: 1 %
Lymphocytes Absolute: 2.1 10*3/uL (ref 0.7–3.1)
Lymphs: 29 %
MCH: 29.8 pg (ref 26.6–33.0)
MCHC: 32.9 g/dL (ref 31.5–35.7)
MCV: 90 fL (ref 79–97)
Monocytes Absolute: 0.7 10*3/uL (ref 0.1–0.9)
Monocytes: 9 %
Neutrophils Absolute: 4.3 10*3/uL (ref 1.4–7.0)
Neutrophils: 57 %
Platelets: 297 10*3/uL (ref 150–450)
RBC: 4.77 x10E6/uL (ref 3.77–5.28)
RDW: 12.1 % (ref 11.7–15.4)
WBC: 7.4 10*3/uL (ref 3.4–10.8)

## 2018-09-19 LAB — LIPID PANEL
Chol/HDL Ratio: 4 ratio (ref 0.0–4.4)
Cholesterol, Total: 198 mg/dL (ref 100–199)
HDL: 49 mg/dL (ref >39)
LDL Calculated: 108 mg/dL — ABNORMAL HIGH (ref 0–99)
Triglycerides: 204 mg/dL — ABNORMAL HIGH (ref 0–149)
VLDL Cholesterol Cal: 41 mg/dL — ABNORMAL HIGH (ref 5–40)

## 2018-09-19 LAB — HEMOGLOBIN A1C
Est. average glucose Bld gHb Est-mCnc: 117 mg/dL
Hgb A1c MFr Bld: 5.7 % — ABNORMAL HIGH (ref 4.8–5.6)

## 2018-09-19 LAB — TSH: TSH: 1.79 u[IU]/mL (ref 0.450–4.500)

## 2018-10-16 ENCOUNTER — Telehealth: Payer: Self-pay

## 2018-10-16 DIAGNOSIS — Z20822 Contact with and (suspected) exposure to covid-19: Secondary | ICD-10-CM

## 2018-10-16 NOTE — Telephone Encounter (Signed)
Incoming call from [facility] [employer].  Referring pt.  For Covid-19 Screening.  Telephone call to Patient schedule for Thursday October 17, 2018 @ 0800am.  Patient voices understanding.

## 2018-10-16 NOTE — Telephone Encounter (Signed)
Patient called saying her husband was tested for Covid on Monday 10/14/18 and got the callt he next day that he tested positive. His sxs include, diarrhea, headache, cough, and body aches.  She was told by the health department to call her PCP and be tested for Covid. Patient has not had any symptoms but a headache. She has been coughing for 1.5 month and its unrelated.   Told patient that she does need to quarantine for 14 days. We cannot send her for symptoms unless she develops symptoms. If she does, told patient to call us and we will do a telephone visit and send her to be testing.  She verbalized understanding and said she will call health department back to inform this.

## 2018-10-17 ENCOUNTER — Other Ambulatory Visit: Payer: Self-pay

## 2018-10-17 DIAGNOSIS — Z20822 Contact with and (suspected) exposure to covid-19: Secondary | ICD-10-CM

## 2018-10-21 ENCOUNTER — Telehealth: Payer: Self-pay

## 2018-10-21 NOTE — Telephone Encounter (Signed)
Received call from [facility] that patient had positive result for Coronavirus. They told us if they are positive they send it over to PCP to call.  Called and spoke with pt informing of POSITIVE RESULTS.  Told her she needs to contact anyone she has been in contact with. Also, told her she will need stay in her home and quarantine for 14 days from the day she was tested.  She verbalized understanding.  She is feeling fine, just has a headache and a cough. No fever.

## 2018-10-22 ENCOUNTER — Other Ambulatory Visit: Payer: Self-pay

## 2018-10-24 LAB — NOVEL CORONAVIRUS, NAA: SARS-CoV-2, NAA: DETECTED — AB

## 2018-11-04 ENCOUNTER — Telehealth: Payer: Self-pay | Admitting: *Deleted

## 2018-11-04 ENCOUNTER — Telehealth: Payer: Self-pay

## 2018-11-04 ENCOUNTER — Other Ambulatory Visit: Payer: Self-pay

## 2018-11-04 DIAGNOSIS — Z20822 Contact with and (suspected) exposure to covid-19: Secondary | ICD-10-CM

## 2018-11-04 NOTE — Telephone Encounter (Signed)
-----   Message from Clista Bernhardt, [place] sent at 11/04/2018 11:38 AM EDT ----- Regarding: Needs Covid Testing Repeated Patient tested POSITIVE for Covid 10/17/2018. Patient cannot return to work without a negative test result. Patient needs to be retested. Ordered per Dr. Halina Maidens her PCP.   Please Advise.

## 2018-11-04 NOTE — Telephone Encounter (Signed)
Patient called saying she is supposed to return to work Monday 11/11/2018 but cannot return to work without a negative test results.   Dr. Army Melia aware after last conversation with patient. Sent [facility] Testing Pool Message to have patient retesting to go back to work.  Told patient someone will call to schedule her testing and inform her where to go.

## 2018-11-04 NOTE — Telephone Encounter (Signed)
Call to patient- patient scheduled for testing and order placed.

## 2018-11-08 LAB — NOVEL CORONAVIRUS, NAA: SARS-CoV-2, NAA: NOT DETECTED

## 2019-01-21 ENCOUNTER — Encounter: Payer: Self-pay | Admitting: Internal Medicine

## 2019-01-21 ENCOUNTER — Ambulatory Visit (INDEPENDENT_AMBULATORY_CARE_PROVIDER_SITE_OTHER): Payer: Self-pay | Admitting: Internal Medicine

## 2019-01-21 ENCOUNTER — Other Ambulatory Visit: Payer: Self-pay

## 2019-01-21 VITALS — BP 122/76 | HR 96 | Ht 59.0 in | Wt 227.0 lb

## 2019-01-21 DIAGNOSIS — K580 Irritable bowel syndrome with diarrhea: Secondary | ICD-10-CM | POA: Insufficient documentation

## 2019-01-21 DIAGNOSIS — I10 Essential (primary) hypertension: Secondary | ICD-10-CM

## 2019-01-21 NOTE — Patient Instructions (Signed)
Use imodium once a day as needed to help reduce diarrhea.

## 2019-01-21 NOTE — Progress Notes (Signed)
Date:  01/21/2019   Name:  Nicole Vargas   DOB:  1970/11/16   MRN:  [id number]   Chief Complaint: Dizziness (On and off- told at visit before. Got up yesterday and ate 3 eggs and bacon. Went to work and started having upset stomach- diarrhea. Felt weak and lightheaded after this. Feeling fine today.  )  Dizziness This is a new problem. The current episode started yesterday (after an episode of more severe diarrhea). The problem has been resolved. Pertinent negatives include no chest pain, chills, coughing, fatigue, fever or headaches.  Diarrhea  This is a recurrent problem. Progression since onset: comes and goes, certain foods are triggers, other times it occurs at random. The stool consistency is described as watery. The patient states that diarrhea does not awaken her from sleep. Pertinent negatives include no chills, coughing, fever or headaches. Associated symptoms comments: Yesterday she had dizziness after a severe episode of diarrhea. She has tried nothing for the symptoms.    Review of Systems  Constitutional: Negative for chills, fatigue, fever and unexpected weight change.  Respiratory: Negative for cough, chest tightness, shortness of breath and wheezing.   Cardiovascular: Negative for chest pain and palpitations.  Gastrointestinal: Positive for diarrhea.  Neurological: Positive for dizziness. Negative for syncope, light-headedness and headaches.  Psychiatric/Behavioral: Negative for dysphoric mood and sleep disturbance. The patient is not nervous/anxious.     Patient Active Problem List   Diagnosis Date Noted  . Migraine without aura and without status migrainosus, not intractable 09/12/2017  . Arthritis of ankle, right 02/23/2017  . S/P breast biopsy, left 10/11/2016  . Hyperlipidemia, mixed 08/24/2016  . History of difficulty in concentrating 05/30/2016  . Plantar fasciitis, bilateral 04/14/2016  . Iron deficiency 07/05/2015  . Vitamin D deficiency  11/24/2014  . Prediabetes 11/24/2014  . Essential (primary) hypertension 08/06/2014  . Obesity, Class III, BMI 40-49.9 (morbid obesity) (HCC) 08/06/2014    Allergies  Allergen Reactions  . Latex Rash    Past Surgical History:  Procedure Laterality Date  . BREAST BIOPSY Left 10/09/2016   Afffirm Biopsy- path pending  . CESAREAN SECTION  03/04/2000  . CHOLECYSTECTOMY  2006  . TUBAL LIGATION  2006    Social History   Tobacco Use  . Smoking status: Never Smoker  . Smokeless tobacco: Never Used  Substance Use Topics  . Alcohol use: No    Alcohol/week: 0.0 standard drinks  . Drug use: No     Medication list has been reviewed and updated.  Current Meds  Medication Sig  . Cholecalciferol (VITAMIN D) 2000 units CAPS Take 1 capsule (2,000 Units total) by mouth daily.  . ferrous sulfate 325 (65 FE) MG EC tablet Take 1 tablet (325 mg total) by mouth daily with breakfast.  . lisinopril-hydrochlorothiazide (ZESTORETIC) 20-25 MG tablet Take 1 tablet by mouth daily. (Patient taking differently: Take 0.5 tablets by mouth daily. )    PHQ 2/9 Scores 01/21/2019 09/18/2018 08/29/2018 09/12/2017  PHQ - 2 Score 0 0 0 0    BP Readings from Last 3 Encounters:  01/21/19 122/76  09/18/18 124/78  08/29/18 128/80    Physical Exam Vitals signs and nursing note reviewed.  Constitutional:      General: She is not in acute distress.    Appearance: She is well-developed.  HENT:     Head: Normocephalic and atraumatic.  Neck:     Musculoskeletal: Normal range of motion and neck supple.  Cardiovascular:     Rate and  Rhythm: Normal rate and regular rhythm.     Pulses: Normal pulses.  Pulmonary:     Effort: Pulmonary effort is normal. No respiratory distress.     Breath sounds: No stridor.  Abdominal:     Palpations: Abdomen is soft. There is no mass.     Tenderness: There is no abdominal tenderness.  Musculoskeletal: Normal range of motion.     Right lower leg: No edema.     Left lower  leg: No edema.  Lymphadenopathy:     Cervical: No cervical adenopathy.  Skin:    General: Skin is warm and dry.     Capillary Refill: Capillary refill takes less than 2 seconds.     Findings: No rash.  Neurological:     Mental Status: She is alert and oriented to person, place, and time.  Psychiatric:        Behavior: Behavior normal.        Thought Content: Thought content normal.     Wt Readings from Last 3 Encounters:  01/21/19 227 lb (103 kg)  09/18/18 237 lb (107.5 kg)  08/29/18 240 lb (108.9 kg)    BP 122/76   Pulse 96   Ht 4\' 11"  (1.499 m)   Wt 227 lb (103 kg)   LMP 01/16/2019   SpO2 96%   BMI 45.85 kg/m   Assessment and Plan: 1. Essential (primary) hypertension BP controlled  2. Irritable bowel syndrome with diarrhea Suspect dizziness was secondary to severe diarrhea, and mild fluid deficit. Increase fluid intake Take imodium once a day as needed   Partially dictated using Editor, commissioning. Any errors are unintentional.  Halina Maidens, MD [facility]  01/21/2019

## 2019-07-23 ENCOUNTER — Ambulatory Visit: Payer: Self-pay

## 2019-07-25 ENCOUNTER — Ambulatory Visit: Payer: Self-pay | Attending: Internal Medicine

## 2019-07-25 DIAGNOSIS — Z23 Encounter for immunization: Secondary | ICD-10-CM

## 2019-07-25 NOTE — Progress Notes (Signed)
   Covid-19 Vaccination Clinic  Name:  Emerie Vanderkolk    MRN: [id number] DOB: 1970-11-21  07/25/2019  Ms. Benham was observed post Covid-19 immunization for 15 minutes without incident. She was provided with Vaccine Information Sheet and instruction to access the V-Safe system.   Ms. Hedstrom was instructed to call 911 with any severe reactions post vaccine: Marland Kitchen Difficulty breathing  . Swelling of face and throat  . A fast heartbeat  . A bad rash all over body  . Dizziness and weakness   Immunizations Administered    Name Date Dose VIS Date Route   Pfizer COVID-19 Vaccine 07/25/2019  8:29 AM 0.3 mL 04/04/2019 Intramuscular   Manufacturer: [employer], [employer]   Lot: [id number]   NDC: [address]-7

## 2019-08-19 ENCOUNTER — Ambulatory Visit: Payer: Self-pay | Attending: Internal Medicine

## 2019-08-19 DIAGNOSIS — Z23 Encounter for immunization: Secondary | ICD-10-CM

## 2019-08-19 NOTE — Progress Notes (Signed)
   Covid-19 Vaccination Clinic  Name:  Nicole Vargas    MRN: [id number] DOB: May 05, 1970  08/19/2019  [name] was observed post Covid-19 immunization for 15 minutes without incident. She was provided with Vaccine Information Sheet and instruction to access the V-Safe system.   [name] was instructed to call 911 with any severe reactions post vaccine: Marland Kitchen Difficulty breathing  . Swelling of face and throat  . A fast heartbeat  . A bad rash all over body  . Dizziness and weakness   Immunizations Administered    Name Date Dose VIS Date Route   Pfizer COVID-19 Vaccine 08/19/2019  4:18 PM 0.3 mL 06/18/2018 Intramuscular   Manufacturer: [employer], [employer]   Lot: [id number]   NDC: [address]-9

## 2019-09-19 ENCOUNTER — Encounter: Payer: Self-pay | Admitting: Internal Medicine

## 2019-09-19 ENCOUNTER — Ambulatory Visit (INDEPENDENT_AMBULATORY_CARE_PROVIDER_SITE_OTHER): Payer: Self-pay | Admitting: Internal Medicine

## 2019-09-19 ENCOUNTER — Other Ambulatory Visit: Payer: Self-pay

## 2019-09-19 VITALS — BP 118/82 | HR 88 | Temp 98.2°F | Ht 59.0 in | Wt 224.0 lb

## 2019-09-19 DIAGNOSIS — Z0000 Encounter for general adult medical examination without abnormal findings: Secondary | ICD-10-CM

## 2019-09-19 DIAGNOSIS — K580 Irritable bowel syndrome with diarrhea: Secondary | ICD-10-CM

## 2019-09-19 DIAGNOSIS — Z1231 Encounter for screening mammogram for malignant neoplasm of breast: Secondary | ICD-10-CM

## 2019-09-19 DIAGNOSIS — I10 Essential (primary) hypertension: Secondary | ICD-10-CM

## 2019-09-19 DIAGNOSIS — E782 Mixed hyperlipidemia: Secondary | ICD-10-CM

## 2019-09-19 DIAGNOSIS — N951 Menopausal and female climacteric states: Secondary | ICD-10-CM | POA: Insufficient documentation

## 2019-09-19 DIAGNOSIS — R7303 Prediabetes: Secondary | ICD-10-CM

## 2019-09-19 LAB — POCT URINALYSIS DIPSTICK
Bilirubin, UA: NEGATIVE
Glucose, UA: NEGATIVE
Ketones, UA: NEGATIVE
Nitrite, UA: NEGATIVE
Protein, UA: NEGATIVE
Spec Grav, UA: 1.01 (ref 1.010–1.025)
Urobilinogen, UA: 0.2 E.U./dL
pH, UA: 7.5 (ref 5.0–8.0)

## 2019-09-19 MED ORDER — LISINOPRIL-HYDROCHLOROTHIAZIDE 20-25 MG PO TABS: 0.5000 | ORAL_TABLET | Freq: Every day | ORAL | 3 refills | Status: DC

## 2019-09-19 NOTE — Progress Notes (Signed)
Date:  09/19/2019   Name:  Nicole Vargas   DOB:  1971-02-24   MRN:  [id number]   Chief Complaint: Annual Exam (breast exam/ no pap/ask about having mood swings) Nicole Vargas is a 49 y.o. female who presents today for her Complete Annual Exam. She feels well. She reports exercising none. She reports she is sleeping fairly well.   Mammogram 2018 Pap  08/2016 neg with cotesting Immunization History  Administered Date(s) Administered  . Influenza,inj,Quad PF,6+ Mos 02/23/2017  . PFIZER SARS-COV-2 Vaccination 07/25/2019, 08/19/2019    Hypertension This is a chronic problem. The problem is controlled. Associated symptoms include headaches. Pertinent negatives include no chest pain, palpitations or shortness of breath. Past treatments include ACE inhibitors and diuretics. The current treatment provides significant improvement.  Diabetes She presents for her follow-up diabetic visit. Diabetes type: prediabetes. Her disease course has been stable. Hypoglycemia symptoms include headaches. Pertinent negatives for hypoglycemia include no confusion, dizziness, nervousness/anxiousness or tremors. Pertinent negatives for diabetes include no chest pain, no fatigue, no polydipsia and no polyuria. Current diabetic treatment includes diet. She is compliant with treatment most of the time.  Mood swings - she is having episodic mood swings from angry to crying intermittently over the past few months.  Her periods are changing as well - lighter than usual.  She is also have some sweats and hot flashes.  Lab Results  Component Value Date   CREATININE 0.66 09/18/2018   BUN 13 09/18/2018   NA 141 09/18/2018   K 4.8 09/18/2018   CL 100 09/18/2018   CO2 23 09/18/2018   Lab Results  Component Value Date   CHOL 198 09/18/2018   HDL 49 09/18/2018   LDLCALC 108 (H) 09/18/2018   TRIG 204 (H) 09/18/2018   CHOLHDL 4.0 09/18/2018   Lab Results  Component Value Date   TSH 1.790  09/18/2018   Lab Results  Component Value Date   HGBA1C 5.7 (H) 09/18/2018   Lab Results  Component Value Date   WBC 7.4 09/18/2018   HGB 14.2 09/18/2018   HCT 43.1 09/18/2018   MCV 90 09/18/2018   PLT 297 09/18/2018   Lab Results  Component Value Date   ALT 19 09/18/2018   AST 21 09/18/2018   ALKPHOS 34 (L) 09/18/2018   BILITOT 0.3 09/18/2018     Review of Systems  Constitutional: Negative for chills, fatigue and fever.  HENT: Negative for congestion, hearing loss, tinnitus, trouble swallowing and voice change.   Eyes: Negative for visual disturbance.  Respiratory: Negative for cough, chest tightness, shortness of breath and wheezing.   Cardiovascular: Negative for chest pain, palpitations and leg swelling.  Gastrointestinal: Positive for diarrhea (stools 5-6 times per day). Negative for abdominal pain, constipation and vomiting.       Gerd   Endocrine: Negative for polydipsia and polyuria.  Genitourinary: Positive for menstrual problem (regular but very light; overall getting lighter). Negative for dysuria, frequency, genital sores, vaginal bleeding and vaginal discharge.  Musculoskeletal: Negative for arthralgias, gait problem and joint swelling.  Skin: Negative for color change and rash.  Neurological: Positive for headaches. Negative for dizziness, tremors and light-headedness.  Hematological: Negative for adenopathy. Does not bruise/bleed easily.  Psychiatric/Behavioral: Negative for confusion, dysphoric mood and sleep disturbance. The patient is not nervous/anxious.        Mood swings during her period    Patient Active Problem List   Diagnosis Date Noted  . Irritable bowel syndrome with diarrhea 01/21/2019  .  Migraine without aura and without status migrainosus, not intractable 09/12/2017  . Arthritis of ankle, right 02/23/2017  . S/P breast biopsy, left 10/11/2016  . Hyperlipidemia, mixed 08/24/2016  . History of difficulty in concentrating 05/30/2016  .  Plantar fasciitis, bilateral 04/14/2016  . Iron deficiency 07/05/2015  . Vitamin D deficiency 11/24/2014  . Prediabetes 11/24/2014  . Essential (primary) hypertension 08/06/2014  . Obesity, Class III, BMI 40-49.9 (morbid obesity) ([place]) 08/06/2014    Allergies  Allergen Reactions  . Latex Rash    Past Surgical History:  Procedure Laterality Date  . BREAST BIOPSY Left 10/09/2016   Afffirm Biopsy- path pending  . CESAREAN SECTION  03/04/2000  . CHOLECYSTECTOMY  2006  . TUBAL LIGATION  2006    Social History   Tobacco Use  . Smoking status: Never Smoker  . Smokeless tobacco: Never Used  Substance Use Topics  . Alcohol use: No    Alcohol/week: 0.0 standard drinks  . Drug use: No     Medication list has been reviewed and updated.  Current Meds  Medication Sig  . Cholecalciferol (VITAMIN D) 2000 units CAPS Take 1 capsule (2,000 Units total) by mouth daily.  . ferrous sulfate 325 (65 FE) MG EC tablet Take 1 tablet (325 mg total) by mouth daily with breakfast.  . lisinopril-hydrochlorothiazide (ZESTORETIC) 20-25 MG tablet Take 1 tablet by mouth daily. (Patient taking differently: Take 0.5 tablets by mouth daily. )    PHQ 2/9 Scores 09/19/2019 01/21/2019 09/18/2018 08/29/2018  PHQ - 2 Score 0 0 0 0  PHQ- 9 Score 0 - - -   GAD 7 : Generalized Anxiety Score 09/19/2019  Nervous, Anxious, on Edge 0  Control/stop worrying 0  Worry too much - different things 0  Trouble relaxing 0  Restless 0  Easily annoyed or irritable 0  Afraid - awful might happen 0  Total GAD 7 Score 0  Anxiety Difficulty Not difficult at all    BP Readings from Last 3 Encounters:  09/19/19 118/82  01/21/19 122/76  09/18/18 124/78    Physical Exam Vitals and nursing note reviewed.  Constitutional:      General: She is not in acute distress.    Appearance: Normal appearance. She is well-developed.  HENT:     Head: Normocephalic and atraumatic.     Right Ear: Tympanic membrane and ear canal  normal.     Left Ear: Tympanic membrane and ear canal normal.     Nose:     Right Sinus: No maxillary sinus tenderness.     Left Sinus: No maxillary sinus tenderness.  Eyes:     General: No scleral icterus.       Right eye: No discharge.        Left eye: No discharge.     Conjunctiva/sclera: Conjunctivae normal.  Neck:     Thyroid: No thyromegaly.     Vascular: No carotid bruit.  Cardiovascular:     Rate and Rhythm: Normal rate and regular rhythm.     Pulses: Normal pulses.     Heart sounds: Normal heart sounds.  Pulmonary:     Effort: Pulmonary effort is normal. No respiratory distress.     Breath sounds: No wheezing.  Chest:     Breasts:        Right: No mass, nipple discharge, skin change or tenderness.        Left: No mass, nipple discharge, skin change or tenderness.  Abdominal:     General: Bowel sounds are  normal.     Palpations: Abdomen is soft.     Tenderness: There is no abdominal tenderness. There is no guarding or rebound.  Musculoskeletal:        General: Normal range of motion.     Cervical back: Normal range of motion. No erythema.     Right lower leg: No edema.     Left lower leg: No edema.  Lymphadenopathy:     Cervical: No cervical adenopathy.  Skin:    General: Skin is warm and dry.     Capillary Refill: Capillary refill takes less than 2 seconds.     Findings: No rash.  Neurological:     General: No focal deficit present.     Mental Status: She is alert and oriented to person, place, and time.     Cranial Nerves: No cranial nerve deficit.     Sensory: No sensory deficit.     Deep Tendon Reflexes: Reflexes are normal and symmetric.  Psychiatric:        Mood and Affect: Mood normal.        Speech: Speech normal.        Behavior: Behavior normal.        Thought Content: Thought content normal.     Wt Readings from Last 3 Encounters:  09/19/19 224 lb (101.6 kg)  01/21/19 227 lb (103 kg)  09/18/18 237 lb (107.5 kg)    BP 118/82   Pulse 88    Temp 98.2 F (36.8 C) (Oral)   Ht 4\' 11"  (1.499 m)   Wt 224 lb (101.6 kg)   LMP 09/11/2019 (Exact Date) Comment: light bleeding not as heavy  SpO2 96%   BMI 45.24 kg/m   Assessment and Plan: 1. Annual physical exam Normal exam except for weight - down 10 lbs Continue healthy diet, efforts at weight loss - POCT urinalysis dipstick  2. Encounter for screening mammogram for breast cancer To be scheduled - MM 3D SCREEN BREAST BILATERAL; Future  3. Essential (primary) hypertension Clinically stable exam with well controlled BP on lisinopril/hct. Tolerating medications without side effects at this time. Pt to continue current regimen and low sodium diet; benefits of regular exercise as able discussed. - CBC with Differential/Platelet - TSH - lisinopril-hydrochlorothiazide (ZESTORETIC) 20-25 MG tablet; Take 0.5 tablets by mouth daily.  Dispense: 45 tablet; Refill: 3  4. Prediabetes Check labs and advise Continue to work on low carb diet - Comprehensive metabolic panel - Hemoglobin A1c  5. Hyperlipidemia, mixed Check lab and advise - Lipid panel  6. Obesity, Class III, BMI 40-49.9 (morbid obesity) (HCC) Continue weight loss efforts  7. Irritable bowel syndrome with diarrhea Recommend continued avoidance of triggers Imodium PRN severe sx Consider GI consult if worsening  8. Perimenopausal symptoms With vasomotor sx and mood swings Discussed starting SSRI if needed Monitor sx for now and return PRN   Partially dictated using Dragon software. Any errors are unintentional.  09/13/2019, MD [facility]  09/19/2019

## 2019-09-20 LAB — CBC WITH DIFFERENTIAL/PLATELET
Basophils Absolute: 0 10*3/uL (ref 0.0–0.2)
Basos: 1 %
EOS (ABSOLUTE): 0.2 10*3/uL (ref 0.0–0.4)
Eos: 3 %
Hematocrit: 44.1 % (ref 34.0–46.6)
Hemoglobin: 14.8 g/dL (ref 11.1–15.9)
Immature Grans (Abs): 0 10*3/uL (ref 0.0–0.1)
Immature Granulocytes: 0 %
Lymphocytes Absolute: 2 10*3/uL (ref 0.7–3.1)
Lymphs: 30 %
MCH: 30.2 pg (ref 26.6–33.0)
MCHC: 33.6 g/dL (ref 31.5–35.7)
MCV: 90 fL (ref 79–97)
Monocytes Absolute: 0.6 10*3/uL (ref 0.1–0.9)
Monocytes: 9 %
Neutrophils Absolute: 3.7 10*3/uL (ref 1.4–7.0)
Neutrophils: 57 %
Platelets: 280 10*3/uL (ref 150–450)
RBC: 4.9 x10E6/uL (ref 3.77–5.28)
RDW: 12 % (ref 11.7–15.4)
WBC: 6.6 10*3/uL (ref 3.4–10.8)

## 2019-09-20 LAB — COMPREHENSIVE METABOLIC PANEL
ALT: 13 IU/L (ref 0–32)
AST: 17 IU/L (ref 0–40)
Albumin/Globulin Ratio: 1.4 (ref 1.2–2.2)
Albumin: 4.2 g/dL (ref 3.8–4.8)
Alkaline Phosphatase: 32 IU/L — ABNORMAL LOW (ref 48–121)
BUN/Creatinine Ratio: 20 (ref 9–23)
BUN: 12 mg/dL (ref 6–24)
Bilirubin Total: 0.5 mg/dL (ref 0.0–1.2)
CO2: 20 mmol/L (ref 20–29)
Calcium: 9 mg/dL (ref 8.7–10.2)
Chloride: 103 mmol/L (ref 96–106)
Creatinine, Ser: 0.61 mg/dL (ref 0.57–1.00)
GFR calc Af Amer: 125 mL/min/{1.73_m2} (ref >59)
GFR calc non Af Amer: 108 mL/min/{1.73_m2} (ref >59)
Globulin, Total: 3 g/dL (ref 1.5–4.5)
Glucose: 90 mg/dL (ref 65–99)
Potassium: 4.7 mmol/L (ref 3.5–5.2)
Sodium: 137 mmol/L (ref 134–144)
Total Protein: 7.2 g/dL (ref 6.0–8.5)

## 2019-09-20 LAB — LIPID PANEL
Chol/HDL Ratio: 4 ratio (ref 0.0–4.4)
Cholesterol, Total: 228 mg/dL — ABNORMAL HIGH (ref 100–199)
HDL: 57 mg/dL (ref >39)
LDL Chol Calc (NIH): 144 mg/dL — ABNORMAL HIGH (ref 0–99)
Triglycerides: 154 mg/dL — ABNORMAL HIGH (ref 0–149)
VLDL Cholesterol Cal: 27 mg/dL (ref 5–40)

## 2019-09-20 LAB — HEMOGLOBIN A1C
Est. average glucose Bld gHb Est-mCnc: 114 mg/dL
Hgb A1c MFr Bld: 5.6 % (ref 4.8–5.6)

## 2019-09-20 LAB — TSH: TSH: 1.23 u[IU]/mL (ref 0.450–4.500)

## 2019-10-01 ENCOUNTER — Telehealth: Payer: Self-pay | Admitting: Internal Medicine

## 2019-10-01 ENCOUNTER — Telehealth: Payer: Self-pay

## 2019-10-01 NOTE — Telephone Encounter (Signed)
Pt called and stated she was concerned about her periods. Said she had a period on May 20th and another period this week June 7th. Spoke with Dr Judithann Graves and explained she is in perimenopause and this will happen before her periods completely stop.  Told her to monitor her periods and if she starts having them to often, or bleeing to heavy then told her she will need to see a GYN doctor.  CM

## 2019-10-01 NOTE — Telephone Encounter (Signed)
Called pt left VM to call back.  KP 

## 2019-10-01 NOTE — Telephone Encounter (Signed)
Copied from CRM [id number]. Topic: General - Inquiry >> Sep 30, 2019  4:31 PM Deborha Payment wrote: Reason for CRM: Patient is requesting a call back from PCP nurse. Call back [phone]

## 2019-10-13 ENCOUNTER — Telehealth: Payer: Self-pay | Admitting: Internal Medicine

## 2019-10-13 NOTE — Telephone Encounter (Signed)
Copied from CRM [id number]. Topic: General - Other >> Oct 13, 2019  2:04 PM Gwenlyn Fudge wrote: Reason for CRM: Pt called and is requesting to have a note regarding her medical condition that makes it impossible for her to wait to use the restroom. Pt is requesting to have this sent to her email. Pt is requesting a call after 4. Please advise.

## 2019-10-13 NOTE — Telephone Encounter (Signed)
Pt called in to follow up on message sent. Pt would like a call back to discuss further.

## 2019-10-14 NOTE — Telephone Encounter (Signed)
Called pt left VM that we could not give her a note for work. That she would have to get that thru Urology.  KP

## 2019-10-14 NOTE — Telephone Encounter (Signed)
Noted  KP 

## 2019-10-20 ENCOUNTER — Ambulatory Visit
Admission: EM | Admit: 2019-10-20 | Discharge: 2019-10-20 | Disposition: A | Discharge status: Home / Self Care | Payer: Commercial Managed Care - PPO | Attending: Family Medicine | Admitting: Family Medicine

## 2019-10-20 ENCOUNTER — Other Ambulatory Visit: Payer: Self-pay

## 2019-10-20 ENCOUNTER — Encounter: Payer: Self-pay | Admitting: Emergency Medicine

## 2019-10-20 DIAGNOSIS — R5383 Other fatigue: Secondary | ICD-10-CM | POA: Diagnosis not present

## 2019-10-20 NOTE — ED Provider Notes (Signed)
[facility]    CSN: [id number] Arrival date & time: 10/20/19  1035      History   Chief Complaint Chief Complaint  Patient presents with  . Fatigue   HPI  49 year old female presents with acute fatigue.  Patient reports that she got up this morning and felt very fatigued.  She reports extreme fatigue.  Patient reports that she had some lower abdominal discomfort and had several bowel movements.  She states that this is relatively normal for her.  She seems to have issues with IBS.  Patient reports that she is very fatigued and not feeling well.  She is unsure of the culprit.  No respiratory symptoms.  No relieving factors.  No other complaints.  Past Medical History:  Diagnosis Date  . Abscess   . Hypertension   . Migraine     Patient Active Problem List   Diagnosis Date Noted  . Perimenopausal symptoms 09/19/2019  . Irritable bowel syndrome with diarrhea 01/21/2019  . Migraine without aura and without status migrainosus, not intractable 09/12/2017  . Arthritis of ankle, right 02/23/2017  . S/P breast biopsy, left 10/11/2016  . Hyperlipidemia, mixed 08/24/2016  . Plantar fasciitis, bilateral 04/14/2016  . Iron deficiency 07/05/2015  . Vitamin D deficiency 11/24/2014  . Prediabetes 11/24/2014  . Essential (primary) hypertension 08/06/2014  . Obesity, Class III, BMI 40-49.9 (morbid obesity) (HCC) 08/06/2014    Past Surgical History:  Procedure Laterality Date  . BREAST BIOPSY Left 10/09/2016   Afffirm Biopsy- path pending  . CESAREAN SECTION  03/04/2000  . CHOLECYSTECTOMY  2006  . TUBAL LIGATION  2006    OB History   No obstetric history on file.      Home Medications    Prior to Admission medications   Medication Sig Start Date End Date Taking? Authorizing Provider  Cholecalciferol (VITAMIN D) 2000 units CAPS Take 1 capsule (2,000 Units total) by mouth daily. 07/05/15  Yes Plonk, Chrissie Noa, MD  ferrous sulfate 325 (65 FE) MG EC tablet Take 1  tablet (325 mg total) by mouth daily with breakfast. 07/05/15  Yes Plonk, Chrissie Noa, MD  lisinopril-hydrochlorothiazide (ZESTORETIC) 20-25 MG tablet Take 0.5 tablets by mouth daily. 09/19/19  Yes Reubin Milan, MD    Family History Family History  Problem Relation Age of Onset  . Diabetes Maternal Grandmother   . Heart disease Paternal Grandfather   . Breast cancer Maternal Aunt 36       aunt and great aunt    Social History Social History   Tobacco Use  . Smoking status: Never Smoker  . Smokeless tobacco: Never Used  Vaping Use  . Vaping Use: Never used  Substance Use Topics  . Alcohol use: No    Alcohol/week: 0.0 standard drinks  . Drug use: No     Allergies   Latex   Review of Systems Review of Systems  Constitutional: Positive for fatigue.  HENT: Negative.   Respiratory: Negative.    Physical Exam Triage Vital Signs ED Triage Vitals  Enc Vitals Group     BP 10/20/19 1146 117/76     Pulse Rate 10/20/19 1146 85     Resp 10/20/19 1146 18     Temp 10/20/19 1146 98.2 F (36.8 C)     Temp Source 10/20/19 1146 Oral     SpO2 10/20/19 1146 98 %     Weight 10/20/19 1143 223 lb 15.8 oz (101.6 kg)     Height 10/20/19 1143 4\' 11"  (1.499 m)  Head Circumference --      Peak Flow --      Pain Score 10/20/19 1143 0     Pain Loc --      Pain Edu? --      Excl. in [place]? --    Updated Vital Signs BP 117/76 (BP Location: Right Arm)   Pulse 85   Temp 98.2 F (36.8 C) (Oral)   Resp 18   Ht 4\' 11"  (1.499 m)   Wt 101.6 kg   SpO2 98%   BMI 45.24 kg/m   Visual Acuity Right Eye Distance:   Left Eye Distance:   Bilateral Distance:    Right Eye Near:   Left Eye Near:    Bilateral Near:     Physical Exam Vitals and nursing note reviewed.  Constitutional:      General: She is not in acute distress.    Appearance: Normal appearance. She is obese. She is not ill-appearing.  HENT:     Head: Normocephalic and atraumatic.  Eyes:     General:        Right eye:  No discharge.        Left eye: No discharge.     Conjunctiva/sclera: Conjunctivae normal.  Cardiovascular:     Rate and Rhythm: Normal rate and regular rhythm.     Heart sounds: No murmur heard.   Pulmonary:     Effort: Pulmonary effort is normal.     Breath sounds: Normal breath sounds. No wheezing, rhonchi or rales.  Neurological:     Mental Status: She is alert.  Psychiatric:        Mood and Affect: Mood normal.        Behavior: Behavior normal.    UC Treatments / Results  Labs (all labs ordered are listed, but only abnormal results are displayed) Labs Reviewed - No data to display  EKG   Radiology No results found.  Procedures Procedures (including critical care time)  Medications Ordered in UC Medications - No data to display  Initial Impression / Assessment and Plan / UC Course  I have reviewed the triage vital signs and the nursing notes.  Pertinent labs & imaging results that were available during my care of the patient were reviewed by me and considered in my medical decision making (see chart for details).    49 year old female presents with acute fatigue.  Has had recent labs.  There is no need for further work-up or evaluation at this time.  She is well-appearing on exam.  Advised supportive care.  Follow-up with PCP.  Work note given.  Final Clinical Impressions(s) / UC Diagnoses   Final diagnoses:  Fatigue, unspecified type     Discharge Instructions     Rest.  Follow up with Dr. Carolin Coy  Take care  Dr. Lacinda Axon    ED Prescriptions    None     PDMP not reviewed this encounter.   Coral Spikes, [place] 10/20/19 2108

## 2019-10-20 NOTE — Discharge Instructions (Signed)
Rest.  Follow up with Dr. Asencion Partridge  Take care  Dr. Adriana Simas

## 2019-10-20 NOTE — ED Triage Notes (Addendum)
Pt c/o fatigue. She states she woke up this morning and felt very tired and had some lower abdominal pain. She states she has been having issues with her stomach and told her had possible IBS. She states she had 5 BMs with in a couple hours. She states she feels better fatigue wise and is not having abdominal pain at this time. Denies urinary symptoms. Pt states she is needing a work note.

## 2019-11-02 ENCOUNTER — Other Ambulatory Visit: Payer: Self-pay | Admitting: Internal Medicine

## 2019-11-02 DIAGNOSIS — I10 Essential (primary) hypertension: Secondary | ICD-10-CM

## 2019-11-02 NOTE — Telephone Encounter (Signed)
Requested Prescriptions  Pending Prescriptions Disp Refills  . lisinopril-hydrochlorothiazide (ZESTORETIC) 20-25 MG tablet [Pharmacy Med Name: LISINOPRIL-HCTZ 20-25 MG TAB] 90 tablet 1    Sig: TAKE 1 TABLET BY MOUTH EVERY DAY     Cardiovascular:  ACEI + Diuretic Combos Passed - 11/02/2019 11:56 AM      Passed - Na in normal range and within 180 days    Sodium  Date Value Ref Range Status  09/19/2019 137 134 - 144 mmol/L Final         Passed - K in normal range and within 180 days    Potassium  Date Value Ref Range Status  09/19/2019 4.7 3.5 - 5.2 mmol/L Final         Passed - Cr in normal range and within 180 days    Creatinine, Ser  Date Value Ref Range Status  09/19/2019 0.61 0.57 - 1.00 mg/dL Final         Passed - Ca in normal range and within 180 days    Calcium  Date Value Ref Range Status  09/19/2019 9.0 8.7 - 10.2 mg/dL Final         Passed - Patient is not pregnant      Passed - Last BP in normal range    BP Readings from Last 1 Encounters:  10/20/19 117/76         Passed - Valid encounter within last 6 months    Recent Outpatient Visits          1 month ago Annual physical exam   [facility] Reubin Milan, MD   9 months ago Essential (primary) hypertension   [facility] Reubin Milan, MD   1 year ago Annual physical exam   [facility] Reubin Milan, MD   1 year ago Peri-menopausal   [facility] Reubin Milan, MD   2 years ago Annual physical exam   [facility] Reubin Milan, MD      Future Appointments            In 4 months Judithann Graves Nyoka Cowden, MD [facility]   In 10 months Judithann Graves Nyoka Cowden, MD [facility]

## 2019-11-18 ENCOUNTER — Encounter: Payer: Self-pay | Admitting: Internal Medicine

## 2019-11-18 ENCOUNTER — Ambulatory Visit: Payer: Commercial Managed Care - PPO | Admitting: Internal Medicine

## 2019-11-18 ENCOUNTER — Other Ambulatory Visit: Payer: Self-pay

## 2019-11-18 VITALS — BP 118/80 | HR 93 | Temp 98.0°F | Ht 59.0 in | Wt 229.0 lb

## 2019-11-18 DIAGNOSIS — R197 Diarrhea, unspecified: Secondary | ICD-10-CM

## 2019-11-18 NOTE — Progress Notes (Signed)
Date:  11/18/2019   Name:  Nicole Vargas   DOB:  08-Dec-1970   MRN:  [id number]   Chief Complaint: Diarrhea (Intermittent. Corn, Tomato paste, tortilla chips are causing issues. Sometimes onions cause loose BMs. )  Diarrhea: Patient complains of diarrhea. Onset of diarrhea was several months ago- more than a year. Diarrhea is occurring approximately 1 times per day with controlled diet. Patient describes diarrhea as watery. Diarrhea has been associated with suspicious food/drink - tomato pastes, corn chips, onions- others she cannot think of at this time. Previous visits for diarrhea: none. Evaluation to date: none. Treatment to date: tried imodium which helps but not completely. She has several loose stools most days but other days has more than 5.  Work is making it difficult for her to have bathroom breaks. She has uses imodium intermittently but not daily.  She links her sx to certain foods but the sx are not consistent.  She is s/p cholecystectomy.    Lab Results  Component Value Date   CREATININE 0.61 09/19/2019   BUN 12 09/19/2019   NA 137 09/19/2019   K 4.7 09/19/2019   CL 103 09/19/2019   CO2 20 09/19/2019   Lab Results  Component Value Date   CHOL 228 (H) 09/19/2019   HDL 57 09/19/2019   LDLCALC 144 (H) 09/19/2019   TRIG 154 (H) 09/19/2019   CHOLHDL 4.0 09/19/2019   Lab Results  Component Value Date   TSH 1.230 09/19/2019   Lab Results  Component Value Date   HGBA1C 5.6 09/19/2019   Lab Results  Component Value Date   WBC 6.6 09/19/2019   HGB 14.8 09/19/2019   HCT 44.1 09/19/2019   MCV 90 09/19/2019   PLT 280 09/19/2019   Lab Results  Component Value Date   ALT 13 09/19/2019   AST 17 09/19/2019   ALKPHOS 32 (L) 09/19/2019   BILITOT 0.5 09/19/2019     Review of Systems  Constitutional: Negative for chills, diaphoresis, fatigue and fever.  Respiratory: Negative for chest tightness and shortness of breath.   Cardiovascular: Negative for  chest pain and palpitations.  Gastrointestinal: Positive for diarrhea. Negative for abdominal pain, blood in stool, nausea, rectal pain and vomiting.    Patient Active Problem List   Diagnosis Date Noted  . Perimenopausal symptoms 09/19/2019  . Irritable bowel syndrome with diarrhea 01/21/2019  . Migraine without aura and without status migrainosus, not intractable 09/12/2017  . Arthritis of ankle, right 02/23/2017  . S/P breast biopsy, left 10/11/2016  . Hyperlipidemia, mixed 08/24/2016  . Plantar fasciitis, bilateral 04/14/2016  . Iron deficiency 07/05/2015  . Vitamin D deficiency 11/24/2014  . Prediabetes 11/24/2014  . Essential (primary) hypertension 08/06/2014  . Obesity, Class III, BMI 40-49.9 (morbid obesity) (HCC) 08/06/2014    Allergies  Allergen Reactions  . Latex Rash    Past Surgical History:  Procedure Laterality Date  . BREAST BIOPSY Left 10/09/2016   Afffirm Biopsy- path pending  . CESAREAN SECTION  03/04/2000  . CHOLECYSTECTOMY  2006  . TUBAL LIGATION  2006    Social History   Tobacco Use  . Smoking status: Never Smoker  . Smokeless tobacco: Never Used  Vaping Use  . Vaping Use: Never used  Substance Use Topics  . Alcohol use: No    Alcohol/week: 0.0 standard drinks  . Drug use: No     Medication list has been reviewed and updated.  Current Meds  Medication Sig  . Cholecalciferol (VITAMIN  D) 2000 units CAPS Take 1 capsule (2,000 Units total) by mouth daily.  . ferrous sulfate 325 (65 FE) MG EC tablet Take 1 tablet (325 mg total) by mouth daily with breakfast.  . lisinopril-hydrochlorothiazide (ZESTORETIC) 20-25 MG tablet TAKE 1 TABLET BY MOUTH EVERY DAY    PHQ 2/9 Scores 11/18/2019 09/19/2019 01/21/2019 09/18/2018  PHQ - 2 Score 0 0 0 0  PHQ- 9 Score 0 0 - -    GAD 7 : Generalized Anxiety Score 11/18/2019 09/19/2019  Nervous, Anxious, on Edge 0 0  Control/stop worrying 0 0  Worry too much - different things 0 0  Trouble relaxing 0 0    Restless 0 0  Easily annoyed or irritable 0 0  Afraid - awful might happen 0 0  Total GAD 7 Score 0 0  Anxiety Difficulty Not difficult at all Not difficult at all    BP Readings from Last 3 Encounters:  11/18/19 118/80  10/20/19 117/76  09/19/19 118/82    Physical Exam Vitals and nursing note reviewed.  Constitutional:      General: She is not in acute distress.    Appearance: She is well-developed.  HENT:     Head: Normocephalic and atraumatic.  Cardiovascular:     Rate and Rhythm: Normal rate and regular rhythm.  Pulmonary:     Effort: Pulmonary effort is normal. No respiratory distress.     Breath sounds: No wheezing or rhonchi.  Abdominal:     Palpations: Abdomen is soft.     Tenderness: There is abdominal tenderness. There is no right CVA tenderness, left CVA tenderness, guarding or rebound.  Musculoskeletal:        General: Normal range of motion.     Cervical back: Normal range of motion.  Skin:    General: Skin is warm and dry.     Findings: No rash.  Neurological:     Mental Status: She is alert and oriented to person, place, and time.  Psychiatric:        Mood and Affect: Mood normal.     Wt Readings from Last 3 Encounters:  11/18/19 (!) 229 lb (103.9 kg)  10/20/19 223 lb 15.8 oz (101.6 kg)  09/19/19 224 lb (101.6 kg)    BP 118/80   Pulse 93   Temp 98 F (36.7 C) (Oral)   Ht 4\' 11"  (1.499 m)   Wt (!) 229 lb (103.9 kg)   SpO2 97%   BMI 46.25 kg/m   Assessment and Plan: 1. Diarrhea, unspecified type Begin imodium 1-2 times per day Letter to employer written - Ambulatory referral to Gastroenterology   Partially dictated using Dragon software. Any errors are unintentional.  , MD [facility]  11/18/2019

## 2019-11-18 NOTE — Patient Instructions (Signed)
Take imodium - one a day  - increase to twice a day if needed

## 2020-01-21 ENCOUNTER — Ambulatory Visit: Payer: Commercial Managed Care - PPO | Admitting: Gastroenterology

## 2020-01-22 ENCOUNTER — Ambulatory Visit
Admission: EM | Admit: 2020-01-22 | Discharge: 2020-01-22 | Disposition: A | Discharge status: Home / Self Care | Payer: Commercial Managed Care - PPO | Attending: Emergency Medicine | Admitting: Emergency Medicine

## 2020-01-22 ENCOUNTER — Other Ambulatory Visit: Payer: Self-pay

## 2020-01-22 ENCOUNTER — Encounter: Payer: Self-pay | Admitting: Emergency Medicine

## 2020-01-22 DIAGNOSIS — Z6841 Body Mass Index (BMI) 40.0 and over, adult: Secondary | ICD-10-CM | POA: Insufficient documentation

## 2020-01-22 DIAGNOSIS — J069 Acute upper respiratory infection, unspecified: Secondary | ICD-10-CM | POA: Diagnosis not present

## 2020-01-22 DIAGNOSIS — E782 Mixed hyperlipidemia: Secondary | ICD-10-CM | POA: Diagnosis not present

## 2020-01-22 DIAGNOSIS — R7303 Prediabetes: Secondary | ICD-10-CM | POA: Insufficient documentation

## 2020-01-22 DIAGNOSIS — I10 Essential (primary) hypertension: Secondary | ICD-10-CM | POA: Insufficient documentation

## 2020-01-22 DIAGNOSIS — E559 Vitamin D deficiency, unspecified: Secondary | ICD-10-CM | POA: Diagnosis not present

## 2020-01-22 DIAGNOSIS — Z20822 Contact with and (suspected) exposure to covid-19: Secondary | ICD-10-CM | POA: Insufficient documentation

## 2020-01-22 DIAGNOSIS — K580 Irritable bowel syndrome with diarrhea: Secondary | ICD-10-CM | POA: Insufficient documentation

## 2020-01-22 DIAGNOSIS — H6591 Unspecified nonsuppurative otitis media, right ear: Secondary | ICD-10-CM | POA: Diagnosis not present

## 2020-01-22 DIAGNOSIS — R05 Cough: Secondary | ICD-10-CM | POA: Insufficient documentation

## 2020-01-22 LAB — SARS CORONAVIRUS 2 (TAT 6-24 HRS): SARS Coronavirus 2: NEGATIVE

## 2020-01-22 MED ORDER — AMOXICILLIN-POT CLAVULANATE 875-125 MG PO TABS: 1.0000 | ORAL_TABLET | Freq: Two times a day (BID) | ORAL | 0 refills | Status: AC

## 2020-01-22 MED ORDER — BENZONATATE 100 MG PO CAPS: 200.0000 mg | ORAL_CAPSULE | Freq: Three times a day (TID) | ORAL | 0 refills | Status: DC | PRN

## 2020-01-22 NOTE — ED Provider Notes (Signed)
[facility]    CSN: [id number] Arrival date & time: 01/22/20  0907      History   Chief Complaint Chief Complaint  Patient presents with  . Cough    HPI Nicole Vargas is a 49 y.o. female.   49 yo female here for evaluation of cough, HA, and nasal congestion.  Symptoms started 4 days ago.  She denies fever, SOB, N/V/D, body ahces, ear pressure or ST.  She has had some clear nasal discharge.     Past Medical History:  Diagnosis Date  . Abscess   . Hypertension   . Migraine     Patient Active Problem List   Diagnosis Date Noted  . Perimenopausal symptoms 09/19/2019  . Irritable bowel syndrome with diarrhea 01/21/2019  . Migraine without aura and without status migrainosus, not intractable 09/12/2017  . Arthritis of ankle, right 02/23/2017  . S/P breast biopsy, left 10/11/2016  . Hyperlipidemia, mixed 08/24/2016  . Plantar fasciitis, bilateral 04/14/2016  . Iron deficiency 07/05/2015  . Vitamin D deficiency 11/24/2014  . Prediabetes 11/24/2014  . Essential (primary) hypertension 08/06/2014  . Obesity, Class III, BMI 40-49.9 (morbid obesity) ([place]) 08/06/2014    Past Surgical History:  Procedure Laterality Date  . BREAST BIOPSY Left 10/09/2016   Afffirm Biopsy- path pending  . CESAREAN SECTION  03/04/2000  . CHOLECYSTECTOMY  2006  . TUBAL LIGATION  2006    OB History   No obstetric history on file.      Home Medications    Prior to Admission medications   Medication Sig Start Date End Date Taking? Authorizing Provider  Cholecalciferol (VITAMIN D) 2000 units CAPS Take 1 capsule (2,000 Units total) by mouth daily. 07/05/15  Yes Plonk, Gwyndolyn Saxon, MD  ferrous sulfate 325 (65 FE) MG EC tablet Take 1 tablet (325 mg total) by mouth daily with breakfast. 07/05/15  Yes Plonk, Gwyndolyn Saxon, MD  lisinopril-hydrochlorothiazide (ZESTORETIC) 20-25 MG tablet TAKE 1 TABLET BY MOUTH EVERY DAY 11/02/19  Yes Glean Hess, MD  Loperamide HCl (IMODIUM  PO) Take by mouth.   Yes [provider]  amoxicillin-clavulanate (AUGMENTIN) 875-125 MG tablet Take 1 tablet by mouth every 12 (twelve) hours for 10 days. 01/22/20 02/01/20  Margarette Canada, NP  benzonatate (TESSALON) 100 MG capsule Take 2 capsules (200 mg total) by mouth 3 (three) times daily as needed for cough. 01/22/20   Margarette Canada, NP    Family History Family History  Problem Relation Age of Onset  . Diabetes Maternal Grandmother   . Heart disease Paternal Grandfather   . Breast cancer Maternal Aunt 14       aunt and great aunt    Social History Social History   Tobacco Use  . Smoking status: Never Smoker  . Smokeless tobacco: Never Used  Vaping Use  . Vaping Use: Never used  Substance Use Topics  . Alcohol use: No    Alcohol/week: 0.0 standard drinks  . Drug use: No     Allergies   Latex   Review of Systems Review of Systems  Constitutional: Negative for activity change, appetite change, fatigue and fever.  HENT: Positive for congestion and rhinorrhea. Negative for sinus pressure, sinus pain and sore throat.        Patient has hearing loss at baseline from her job at [employer]- she is tested annually. No change.   Respiratory: Positive for cough. Negative for shortness of breath and wheezing.   Cardiovascular: Negative for chest pain.  Gastrointestinal: Negative for  diarrhea, nausea and vomiting.  Genitourinary: Negative for difficulty urinating and frequency.  Musculoskeletal: Negative for arthralgias and myalgias.  Skin: Negative.   Neurological: Negative for syncope and headaches.  Hematological: Negative.   Psychiatric/Behavioral: Negative.      Physical Exam Triage Vital Signs ED Triage Vitals  Enc Vitals Group     BP 01/22/20 0941 125/75     Pulse Rate 01/22/20 0941 91     Resp 01/22/20 0941 18     Temp 01/22/20 0941 97.8 F (36.6 C)     Temp Source 01/22/20 0941 Oral     SpO2 01/22/20 0941 100 %     Weight 01/22/20 0937 229 lb 0.9 oz (103.9  kg)     Height 01/22/20 0937 '4\' 11"'  (1.499 m)     Head Circumference --      Peak Flow --      Pain Score 01/22/20 0937 0     Pain Loc --      Pain Edu? --      Excl. in [place]? --    No data found.  Updated Vital Signs BP 125/75 (BP Location: Left Arm)   Pulse 91   Temp 97.8 F (36.6 C) (Oral)   Resp 18   Ht '4\' 11"'  (1.499 m)   Wt 229 lb 0.9 oz (103.9 kg)   LMP 01/11/2020 (Approximate)   SpO2 100%   BMI 46.26 kg/m   Visual Acuity Right Eye Distance:   Left Eye Distance:   Bilateral Distance:    Right Eye Near:   Left Eye Near:    Bilateral Near:     Physical Exam Vitals and nursing note reviewed.  Constitutional:      Appearance: Normal appearance. She is not ill-appearing.  HENT:     Head: Normocephalic and atraumatic.     Right Ear: Ear canal and external ear normal.     Left Ear: Tympanic membrane, ear canal and external ear normal.     Ears:     Comments: Right TM is erythematous with a small cloudy effusion.     Nose: Congestion and rhinorrhea present.     Comments: Nasal mucosa is pink with mild edema and clear nasal discharge.     Mouth/Throat:     Mouth: Mucous membranes are moist.     Pharynx: Oropharynx is clear. No oropharyngeal exudate or posterior oropharyngeal erythema.  Eyes:     Extraocular Movements: Extraocular movements intact.     Conjunctiva/sclera: Conjunctivae normal.     Pupils: Pupils are equal, round, and reactive to light.  Cardiovascular:     Rate and Rhythm: Normal rate and regular rhythm.     Pulses: Normal pulses.     Heart sounds: Normal heart sounds.  Pulmonary:     Effort: Pulmonary effort is normal.     Breath sounds: Normal breath sounds. No wheezing, rhonchi or rales.  Musculoskeletal:        General: Normal range of motion.     Cervical back: Normal range of motion and neck supple.  Skin:    General: Skin is dry.     Capillary Refill: Capillary refill takes less than 2 seconds.     Findings: No rash.  Neurological:      General: No focal deficit present.     Mental Status: She is alert and oriented to person, place, and time.  Psychiatric:        Mood and Affect: Mood normal.  Behavior: Behavior normal.        Thought Content: Thought content normal.        Judgment: Judgment normal.      UC Treatments / Results  Labs (all labs ordered are listed, but only abnormal results are displayed) Labs Reviewed  SARS CORONAVIRUS 2 (TAT 6-24 HRS)    EKG   Radiology No results found.  Procedures Procedures (including critical care time)  Medications Ordered in UC Medications - No data to display  Initial Impression / Assessment and Plan / UC Course  I have reviewed the triage vital signs and the nursing notes.  Pertinent labs & imaging results that were available during my care of the patient were reviewed by me and considered in my medical decision making (see chart for details).   Patient has had 4 days of a dry cough but no fever or sputum production.   PE reveals a serous otitis on the right with some inflammation of the upper respiratory tree.   Will treat for OM and D/C with tessalon and supportive care instructions including sinus irrigation.  Final Clinical Impressions(s) / UC Diagnoses   Final diagnoses:  Upper respiratory tract infection, unspecified type  Right serous otitis media, unspecified chronicity     Discharge Instructions     Isolate until your COVID test results are back. If they are positive you will need to quarantine for 10 days from the start of your symptoms. After 10 days you can break quarantine if your symptoms have improved and you have not had a fever for 24 hours.  Take Augmentin twice daily with food for 10 days for your ear infection.  Use the Tessalon perles every 8 hours as needed for cough. Take them with a small sip of water. You may get a numbness to the base of your tongue or a metallic taste in yoru mouth- that is normal.   Rinse your  sinuses twice daily with distilled water and a Neilmed Sinus Rinse Kit. Do not use tap water- even if you boil it. You can heat sterilize the bottle before use by separating the two pieces, putting them on a pie plate, and microwaving them for 90 seconds. Let the pieces cool before using them.  Return for new or worsening symptoms.     ED Prescriptions    Medication Sig Dispense Auth. Provider   amoxicillin-clavulanate (AUGMENTIN) 875-125 MG tablet Take 1 tablet by mouth every 12 (twelve) hours for 10 days. 20 tablet Margarette Canada, NP   benzonatate (TESSALON) 100 MG capsule Take 2 capsules (200 mg total) by mouth 3 (three) times daily as needed for cough. 21 capsule Margarette Canada, NP     PDMP not reviewed this encounter.   Margarette Canada, NP 01/22/20 1012

## 2020-01-22 NOTE — ED Triage Notes (Signed)
Pt c/o headache, cough, nasal congestion. Started about 4 days. Denies fever.

## 2020-01-22 NOTE — Discharge Instructions (Signed)
Isolate until your COVID test results are back. If they are positive you will need to quarantine for 10 days from the start of your symptoms. After 10 days you can break quarantine if your symptoms have improved and you have not had a fever for 24 hours.  Take Augmentin twice daily with food for 10 days for your ear infection.  Use the Tessalon perles every 8 hours as needed for cough. Take them with a small sip of water. You may get a numbness to the base of your tongue or a metallic taste in yoru mouth- that is normal.   Rinse your sinuses twice daily with distilled water and a Neilmed Sinus Rinse Kit. Do not use tap water- even if you boil it. You can heat sterilize the bottle before use by separating the two pieces, putting them on a pie plate, and microwaving them for 90 seconds. Let the pieces cool before using them.  Return for new or worsening symptoms.

## 2020-03-03 ENCOUNTER — Other Ambulatory Visit: Payer: Self-pay

## 2020-03-03 ENCOUNTER — Other Ambulatory Visit: Payer: Self-pay | Admitting: Internal Medicine

## 2020-03-03 ENCOUNTER — Ambulatory Visit: Payer: Commercial Managed Care - PPO | Admitting: Internal Medicine

## 2020-03-03 ENCOUNTER — Encounter: Payer: Self-pay | Admitting: Internal Medicine

## 2020-03-03 VITALS — BP 124/76 | HR 100 | Temp 97.9°F | Ht 59.0 in | Wt 236.4 lb

## 2020-03-03 DIAGNOSIS — I10 Essential (primary) hypertension: Secondary | ICD-10-CM | POA: Diagnosis not present

## 2020-03-03 DIAGNOSIS — Z85828 Personal history of other malignant neoplasm of skin: Secondary | ICD-10-CM | POA: Insufficient documentation

## 2020-03-03 DIAGNOSIS — G479 Sleep disorder, unspecified: Secondary | ICD-10-CM

## 2020-03-03 DIAGNOSIS — Z1231 Encounter for screening mammogram for malignant neoplasm of breast: Secondary | ICD-10-CM

## 2020-03-03 HISTORY — DX: Personal history of other malignant neoplasm of skin: Z85.828

## 2020-03-03 NOTE — Progress Notes (Signed)
Date:  03/03/2020   Name:  Nicole Vargas   DOB:  1970-10-28   MRN:  [id number]   Chief Complaint: Hypertension (Follow up- declined flu shot. )  Hypertension This is a chronic problem. The problem is controlled. Pertinent negatives include no chest pain, headaches or shortness of breath. Past treatments include ACE inhibitors and diuretics. The current treatment provides significant improvement.  Insomnia Primary symptoms: sleep disturbance (snoring), premature morning awakening.  The problem occurs nightly. The problem is unchanged. Duration of naps:  One to two hours.  Prior diagnostic workup includes:  No prior workup.  Does not feel that she gets restful sleep.  Falls asleep easily during the day.  Frequent morning headaches.  Lab Results  Component Value Date   CREATININE 0.61 09/19/2019   BUN 12 09/19/2019   NA 137 09/19/2019   K 4.7 09/19/2019   CL 103 09/19/2019   CO2 20 09/19/2019   Lab Results  Component Value Date   CHOL 228 (H) 09/19/2019   HDL 57 09/19/2019   LDLCALC 144 (H) 09/19/2019   TRIG 154 (H) 09/19/2019   CHOLHDL 4.0 09/19/2019   Lab Results  Component Value Date   TSH 1.230 09/19/2019   Lab Results  Component Value Date   HGBA1C 5.6 09/19/2019   Lab Results  Component Value Date   WBC 6.6 09/19/2019   HGB 14.8 09/19/2019   HCT 44.1 09/19/2019   MCV 90 09/19/2019   PLT 280 09/19/2019   Lab Results  Component Value Date   ALT 13 09/19/2019   AST 17 09/19/2019   ALKPHOS 32 (L) 09/19/2019   BILITOT 0.5 09/19/2019     Review of Systems  Constitutional: Positive for fatigue. Negative for chills, fever and unexpected weight change.  HENT: Negative for trouble swallowing.   Respiratory: Negative for cough, chest tightness, shortness of breath and wheezing.   Cardiovascular: Negative for chest pain.  Neurological: Negative for dizziness, light-headedness and headaches.  Psychiatric/Behavioral: Positive for sleep disturbance  (snoring). Negative for dysphoric mood. The patient has insomnia. The patient is not nervous/anxious.     Patient Active Problem List   Diagnosis Date Noted  . Perimenopausal symptoms 09/19/2019  . Irritable bowel syndrome with diarrhea 01/21/2019  . Migraine without aura and without status migrainosus, not intractable 09/12/2017  . Arthritis of ankle, right 02/23/2017  . S/P breast biopsy, left 10/11/2016  . Hyperlipidemia, mixed 08/24/2016  . Plantar fasciitis, bilateral 04/14/2016  . Iron deficiency 07/05/2015  . Vitamin D deficiency 11/24/2014  . Prediabetes 11/24/2014  . Essential (primary) hypertension 08/06/2014  . Obesity, Class III, BMI 40-49.9 (morbid obesity) (HCC) 08/06/2014    Allergies  Allergen Reactions  . Latex Rash    Past Surgical History:  Procedure Laterality Date  . BREAST BIOPSY Left 10/09/2016   Afffirm Biopsy- path pending  . CESAREAN SECTION  03/04/2000  . CHOLECYSTECTOMY  2006  . TUBAL LIGATION  2006    Social History   Tobacco Use  . Smoking status: Never Smoker  . Smokeless tobacco: Never Used  Vaping Use  . Vaping Use: Never used  Substance Use Topics  . Alcohol use: No    Alcohol/week: 0.0 standard drinks  . Drug use: No     Medication list has been reviewed and updated.  Current Meds  Medication Sig  . Cholecalciferol (VITAMIN D) 2000 units CAPS Take 1 capsule (2,000 Units total) by mouth daily.  . ferrous sulfate 325 (65 FE) MG EC tablet  Take 1 tablet (325 mg total) by mouth daily with breakfast.  . lisinopril-hydrochlorothiazide (ZESTORETIC) 20-25 MG tablet TAKE 1 TABLET BY MOUTH EVERY DAY  . Loperamide HCl (IMODIUM PO) Take by mouth.    PHQ 2/9 Scores 03/03/2020 11/18/2019 09/19/2019 01/21/2019  PHQ - 2 Score 0 0 0 0  PHQ- 9 Score 0 0 0 -    GAD 7 : Generalized Anxiety Score 03/03/2020 11/18/2019 09/19/2019  Nervous, Anxious, on Edge 0 0 0  Control/stop worrying 0 0 0  Worry too much - different things 0 0 0  Trouble  relaxing 0 0 0  Restless 0 0 0  Easily annoyed or irritable 0 0 0  Afraid - awful might happen 0 0 0  Total GAD 7 Score 0 0 0  Anxiety Difficulty Not difficult at all Not difficult at all Not difficult at all    BP Readings from Last 3 Encounters:  03/03/20 124/76  01/22/20 125/75  11/18/19 118/80    Physical Exam Constitutional:      Appearance: She is obese.  Cardiovascular:     Rate and Rhythm: Normal rate and regular rhythm.     Heart sounds: No murmur heard.   Pulmonary:     Effort: Pulmonary effort is normal.     Breath sounds: Normal breath sounds. No wheezing.  Musculoskeletal:     Cervical back: Normal range of motion.  Lymphadenopathy:     Cervical: No cervical adenopathy.  Skin:    Capillary Refill: Capillary refill takes less than 2 seconds.  Neurological:     Mental Status: She is alert.  Psychiatric:        Mood and Affect: Mood normal.        Behavior: Behavior normal.     Wt Readings from Last 3 Encounters:  03/03/20 236 lb 6.4 oz (107.2 kg)  01/22/20 229 lb 0.9 oz (103.9 kg)  11/18/19 (!) 229 lb (103.9 kg)   Results of the Epworth flowsheet 03/03/2020  Sitting and reading 0  Watching TV 3  Sitting, inactive in a public place (e.g. a theatre or a meeting) 2  As a passenger in a car for an hour without a break 3  Lying down to rest in the afternoon when circumstances permit 3  Sitting and talking to someone 0  Sitting quietly after a lunch without alcohol 1  In a car, while stopped for a few minutes in traffic 0  Total score 12    BP 124/76   Pulse 100   Temp 97.9 F (36.6 C) (Oral)   Ht 4\' 11"  (1.499 m)   Wt 236 lb 6.4 oz (107.2 kg)   SpO2 93%   BMI 47.75 kg/m   Assessment and Plan: 1. Essential (primary) hypertension Clinically stable exam with well controlled BP. Tolerating medications without side effects at this time. Pt to continue current regimen and low sodium diet; benefits of regular exercise as able discussed.  2. Sleep  disorder Very suspicious for sleep apnea Will refer for home sleep studies - Ambulatory referral to Sleep Studies   Partially dictated using Dragon software. Any errors are unintentional.  , MD [facility]  03/03/2020

## 2020-03-23 ENCOUNTER — Ambulatory Visit: Payer: Commercial Managed Care - PPO | Attending: Neurology

## 2020-03-23 DIAGNOSIS — G4733 Obstructive sleep apnea (adult) (pediatric): Secondary | ICD-10-CM | POA: Diagnosis not present

## 2020-03-24 ENCOUNTER — Other Ambulatory Visit: Payer: Self-pay

## 2020-03-25 ENCOUNTER — Ambulatory Visit: Payer: Commercial Managed Care - PPO | Admitting: Gastroenterology

## 2020-03-25 ENCOUNTER — Other Ambulatory Visit: Payer: Self-pay

## 2020-03-25 ENCOUNTER — Encounter: Payer: Self-pay | Admitting: Gastroenterology

## 2020-03-25 VITALS — BP 110/75 | HR 103 | Ht 59.0 in | Wt 239.2 lb

## 2020-03-25 DIAGNOSIS — R197 Diarrhea, unspecified: Secondary | ICD-10-CM | POA: Diagnosis not present

## 2020-03-25 MED ORDER — CHOLESTYRAMINE 4 G PO PACK: 4.0000 g | PACK | Freq: Three times a day (TID) | ORAL | 3 refills | Status: DC

## 2020-03-25 NOTE — Progress Notes (Signed)
Gastroenterology Consultation  Referring Provider:     Reubin Milan, MD Primary Care Physician:  Reubin Milan, MD Primary Gastroenterologist:  Dr. Servando Snare     Reason for Consultation:     Diarrhea        HPI:   Nicole Vargas is a 49 y.o. y/o female referred for consultation & management of diarrhea by Dr. Judithann Graves, Nyoka Cowden, MD.  This patient comes in today after being seen by her primary care provider back in July for diarrhea that had been going on for many months to a year.  The patient was getting some mild relief with Imodium and had thought it may have been related to food that she ate.  These foods include corn, tortilla chips, and tomato paste with onions sometimes causing the problems. The patient reports that her diarrhea started shortly after having her gallbladder out.  The patient has been taking Imodium usually once a day but not on the weekends and states that it has worked well in keeping her diarrhea under control.  There is no report of any unexplained weight loss fevers chills black stools or bloody stools.  The patient denies any history of a first-degree relative with colon cancer or colon polyps although she had an uncle with colon cancer.  She now reports that she completely avoids corn except during Thanksgiving recently because of its effect on her diarrhea and when she did have it this Thanksgiving it did not bother her that badly.  Past Medical History:  Diagnosis Date  . Abscess   . Hx of basal cell carcinoma 03/03/2020  . Hypertension   . Migraine     Past Surgical History:  Procedure Laterality Date  . BREAST BIOPSY Left 10/09/2016   Afffirm Biopsy- path pending  . CESAREAN SECTION  03/04/2000  . CHOLECYSTECTOMY  2006  . TUBAL LIGATION  2006    Prior to Admission medications   Medication Sig Start Date End Date Taking? Authorizing Provider  Cholecalciferol (VITAMIN D) 2000 units CAPS Take 1 capsule (2,000 Units total) by mouth daily.  07/05/15   Plonk, Chrissie Noa, MD  ferrous sulfate 325 (65 FE) MG EC tablet Take 1 tablet (325 mg total) by mouth daily with breakfast. 07/05/15   Schuyler Amor, MD  lisinopril-hydrochlorothiazide (ZESTORETIC) 20-25 MG tablet TAKE 1 TABLET BY MOUTH EVERY DAY 11/02/19   Reubin Milan, MD  Loperamide HCl (IMODIUM PO) Take by mouth.    [provider]    Family History  Problem Relation Age of Onset  . Diabetes Maternal Grandmother   . Heart disease Paternal Grandfather   . Breast cancer Maternal Aunt 9       aunt and great aunt     Social History   Tobacco Use  . Smoking status: Never Smoker  . Smokeless tobacco: Never Used  Vaping Use  . Vaping Use: Never used  Substance Use Topics  . Alcohol use: No    Alcohol/week: 0.0 standard drinks  . Drug use: No    Allergies as of 03/25/2020 - Review Complete 03/03/2020  Allergen Reaction Noted  . Latex Rash 07/19/2018    Review of Systems:    All systems reviewed and negative except where noted in HPI.   Physical Exam:  BP 110/75   Pulse (!) 103   Ht 4\' 11"  (1.499 m)   Wt 239 lb 3.2 oz (108.5 kg)   BMI 48.31 kg/m  No LMP recorded. (Menstrual status: Irregular Periods). General:  Alert,  Well-developed, well-nourished, pleasant and cooperative in NAD Head:  Normocephalic and atraumatic. Eyes:  Sclera clear, no icterus.   Conjunctiva pink. Ears:  Normal auditory acuity. Neck:  Supple; no masses or thyromegaly. Lungs:  Respirations even and unlabored.  Clear throughout to auscultation.   No wheezes, crackles, or rhonchi. No acute distress. Heart:  Regular rate and rhythm; no murmurs, clicks, rubs, or gallops. Abdomen:  Normal bowel sounds.  No bruits.  Soft, non-tender and non-distended without masses, hepatosplenomegaly or hernias noted.  No guarding or rebound tenderness.  Negative Carnett sign.   Rectal:  Deferred.  Pulses:  Normal pulses noted. Extremities:  No clubbing or edema.  No cyanosis. Neurologic:  Alert  and oriented x3;  grossly normal neurologically. Skin:  Intact without significant lesions or rashes.  No jaundice. Lymph Nodes:  No significant cervical adenopathy. Psych:  Alert and cooperative. Normal mood and affect.  Imaging Studies: SLEEP STUDY DOCUMENTS  Result Date: 03/25/2020 Ordered by an unspecified provider.  SLEEP STUDY DOCUMENTS  Result Date: 03/24/2020 Ordered by an unspecified provider.   Assessment and Plan:   Nicole Vargas is a 49 y.o. y/o female who comes in today with a history of diarrhea since having her gallbladder removed.  The patient states that the diarrhea is well controlled with Imodium usually once a day.  The patient has been given a prescription for thiamine to be taken once to twice a day to see if her diarrhea improves and stops the need for continued Imodium use.  The patient will also be set up for colonoscopy due to her being 49 years old and never having a colonoscopy in the past.  The patient has been explained the plan and agrees with it.    Midge Minium, MD. Clementeen Graham    Note: This dictation was prepared with Dragon dictation along with smaller phrase technology. Any transcriptional errors that result from this process are unintentional.

## 2020-03-26 ENCOUNTER — Other Ambulatory Visit: Payer: Self-pay

## 2020-03-26 DIAGNOSIS — R197 Diarrhea, unspecified: Secondary | ICD-10-CM

## 2020-04-13 ENCOUNTER — Telehealth: Payer: Self-pay | Admitting: Gastroenterology

## 2020-04-13 NOTE — Telephone Encounter (Signed)
Patient needs prep called into [facility] in [place]. Procedure date is [facility] 04/19/20

## 2020-04-14 ENCOUNTER — Other Ambulatory Visit: Payer: Self-pay

## 2020-04-14 MED ORDER — PEG 3350-KCL-NA BICARB-NACL 420 G PO SOLR: 4000.0000 mL | Freq: Once | ORAL | 0 refills | Status: AC

## 2020-04-14 NOTE — Telephone Encounter (Signed)
Prep has been sent to [facility] in [place].

## 2020-04-15 ENCOUNTER — Telehealth: Payer: Self-pay | Admitting: Gastroenterology

## 2020-04-15 NOTE — Telephone Encounter (Signed)
Patient called stating she went to get a covid test this morning 12.23.21 for procedure with Dr. Servando Snare Monday 12.27.21, but was told her procedure was canceled. Dr. Servando Snare is off on Monday and no one notified pt that procedure was canceled or called to resch. Pt wsa offered the 30th at [facility] and the 6th at [facility] but declined both and states she will cb to resch at later date when she doesn't have to take off work. FYI

## 2020-04-19 ENCOUNTER — Ambulatory Visit: Admit: 2020-04-19 | Payer: Commercial Managed Care - PPO | Admitting: Gastroenterology

## 2020-04-19 SURGERY — COLONOSCOPY WITH PROPOFOL
Anesthesia: Choice

## 2020-04-19 NOTE — Telephone Encounter (Signed)
Contacted pt to discuss her colonoscopy cancellation. Unsure who cancelled procedure without notifying our office. Pt will call back to reschedule.

## 2020-04-27 ENCOUNTER — Other Ambulatory Visit: Payer: Self-pay

## 2020-04-27 ENCOUNTER — Ambulatory Visit
Admission: RE | Admit: 2020-04-27 | Discharge: 2020-04-27 | Disposition: A | Discharge status: Home / Self Care | Payer: Commercial Managed Care - PPO | Source: Ambulatory Visit | Attending: Internal Medicine | Admitting: Internal Medicine

## 2020-04-27 DIAGNOSIS — Z1231 Encounter for screening mammogram for malignant neoplasm of breast: Secondary | ICD-10-CM | POA: Diagnosis not present

## 2020-04-30 ENCOUNTER — Telehealth: Payer: Self-pay | Admitting: Internal Medicine

## 2020-04-30 NOTE — Telephone Encounter (Signed)
Patient is calling for her MM [facility] DIGITAL SCREEN 20 results. Please advise Cb- [phone]

## 2020-05-03 NOTE — Telephone Encounter (Signed)
Left VM and informed of NEG mammo and repeat in 1 year.

## 2020-08-02 ENCOUNTER — Ambulatory Visit: Payer: Self-pay | Admitting: *Deleted

## 2020-08-02 NOTE — Telephone Encounter (Signed)
Patient should keep appt on 04-13 to discuss symptoms in person with Dr Judithann Graves.

## 2020-08-02 NOTE — Telephone Encounter (Signed)
Patient called with c/o dizziness but message stated she is unavalable to talk at this time. She has an appointment in 2 days. Left VM to call back when she can talk.

## 2020-08-02 NOTE — Telephone Encounter (Signed)
Patient called in to say that she have been experiencing dizzy spells states that when she get them and drink water she is ok but that on 07/29/20 she had one and it did not want to go away even after drinking Water. Say that she cant talk to a nurse now because she is at work, does have an appointment scheduled with Dr on Wednesday  Ph# [phone]   Patient unable to take call to assess dizziness at this time.appt scheduled for 08/04/20. Will triage patient if she calls back. Voicemail left by other NT to call back after work.

## 2020-08-04 ENCOUNTER — Other Ambulatory Visit: Payer: Self-pay

## 2020-08-04 ENCOUNTER — Encounter: Payer: Self-pay | Admitting: Internal Medicine

## 2020-08-04 ENCOUNTER — Ambulatory Visit: Payer: Commercial Managed Care - PPO | Admitting: Internal Medicine

## 2020-08-04 VITALS — BP 118/68 | HR 101 | Ht 59.0 in | Wt 234.0 lb

## 2020-08-04 DIAGNOSIS — G4733 Obstructive sleep apnea (adult) (pediatric): Secondary | ICD-10-CM | POA: Diagnosis not present

## 2020-08-04 DIAGNOSIS — R42 Dizziness and giddiness: Secondary | ICD-10-CM

## 2020-08-04 DIAGNOSIS — Z9989 Dependence on other enabling machines and devices: Secondary | ICD-10-CM

## 2020-08-04 DIAGNOSIS — I10 Essential (primary) hypertension: Secondary | ICD-10-CM | POA: Diagnosis not present

## 2020-08-04 NOTE — Patient Instructions (Signed)
Coricidin HBP - try this at bedtime x 5 days

## 2020-08-04 NOTE — Progress Notes (Signed)
Date:  08/04/2020   Name:  Nicole Vargas   DOB:  1970-07-21   MRN:  [id number]   Chief Complaint: Dizziness (Intermittent dizziness. She notices it most when she is not drinking enough water. Never had vertigo in the past. Thursday night of last week she had a dizzy spell from 430 pm to 3AM. She could not even lay down until the spinning stopped. No nausea. No vomiting. )  Dizziness This is a new problem. The current episode started in the past 7 days. The problem occurs intermittently. The problem has been unchanged. Associated symptoms include nausea, vertigo and a visual change. Pertinent negatives include no chest pain, chills, congestion, fatigue, fever or vomiting.  Hypertension This is a chronic problem. The problem is controlled. Pertinent negatives include no chest pain or shortness of breath. Past treatments include ACE inhibitors and diuretics.  OSA - waiting to get fitted for CPAP mask.  Planning to take the time in the next week or two.   Lab Results  Component Value Date   CREATININE 0.61 09/19/2019   BUN 12 09/19/2019   NA 137 09/19/2019   K 4.7 09/19/2019   CL 103 09/19/2019   CO2 20 09/19/2019   Lab Results  Component Value Date   CHOL 228 (H) 09/19/2019   HDL 57 09/19/2019   LDLCALC 144 (H) 09/19/2019   TRIG 154 (H) 09/19/2019   CHOLHDL 4.0 09/19/2019   Lab Results  Component Value Date   TSH 1.230 09/19/2019   Lab Results  Component Value Date   HGBA1C 5.6 09/19/2019   Lab Results  Component Value Date   WBC 6.6 09/19/2019   HGB 14.8 09/19/2019   HCT 44.1 09/19/2019   MCV 90 09/19/2019   PLT 280 09/19/2019   Lab Results  Component Value Date   ALT 13 09/19/2019   AST 17 09/19/2019   ALKPHOS 32 (L) 09/19/2019   BILITOT 0.5 09/19/2019     Review of Systems  Constitutional: Negative for chills, fatigue and fever.  HENT: Negative for congestion, postnasal drip and trouble swallowing.   Respiratory: Negative for chest tightness,  shortness of breath and wheezing.   Cardiovascular: Negative for chest pain.  Gastrointestinal: Positive for nausea. Negative for vomiting.  Neurological: Positive for dizziness and vertigo. Negative for facial asymmetry and light-headedness.  Psychiatric/Behavioral: Negative for dysphoric mood and sleep disturbance. The patient is not nervous/anxious.     Patient Active Problem List   Diagnosis Date Noted  . OSA on CPAP 08/04/2020  . Hx of basal cell carcinoma 03/03/2020  . Sleep disorder 03/03/2020  . Perimenopausal symptoms 09/19/2019  . Irritable bowel syndrome with diarrhea 01/21/2019  . Migraine without aura and without status migrainosus, not intractable 09/12/2017  . Arthritis of ankle, right 02/23/2017  . S/P breast biopsy, left 10/11/2016  . Hyperlipidemia, mixed 08/24/2016  . Plantar fasciitis, bilateral 04/14/2016  . Iron deficiency 07/05/2015  . Vitamin D deficiency 11/24/2014  . Prediabetes 11/24/2014  . Essential (primary) hypertension 08/06/2014  . Obesity, Class III, BMI 40-49.9 (morbid obesity) (HCC) 08/06/2014    Allergies  Allergen Reactions  . Latex Rash    Past Surgical History:  Procedure Laterality Date  . BREAST BIOPSY Left 10/09/2016   Afffirm Biopsy- bx/clip-neg  . CESAREAN SECTION  03/04/2000  . CHOLECYSTECTOMY  2006  . TUBAL LIGATION  2006    Social History   Tobacco Use  . Smoking status: Never Smoker  . Smokeless tobacco: Never Used  Vaping  Use  . Vaping Use: Never used  Substance Use Topics  . Alcohol use: No    Alcohol/week: 0.0 standard drinks  . Drug use: No     Medication list has been reviewed and updated.  Current Meds  Medication Sig  . Cholecalciferol (VITAMIN D) 2000 units CAPS Take 1 capsule (2,000 Units total) by mouth daily.  . cholestyramine (QUESTRAN) 4 g packet Take 1 packet (4 g total) by mouth 3 (three) times daily with meals.  . ferrous sulfate 325 (65 FE) MG EC tablet Take 1 tablet (325 mg total) by mouth  daily with breakfast.  . lisinopril-hydrochlorothiazide (ZESTORETIC) 20-25 MG tablet TAKE 1 TABLET BY MOUTH EVERY DAY  . Loperamide HCl (IMODIUM PO) Take by mouth.    PHQ 2/9 Scores 08/04/2020 03/03/2020 11/18/2019 09/19/2019  PHQ - 2 Score 0 0 0 0  PHQ- 9 Score 0 0 0 0    GAD 7 : Generalized Anxiety Score 08/04/2020 03/03/2020 11/18/2019 09/19/2019  Nervous, Anxious, on Edge 0 0 0 0  Control/stop worrying 0 0 0 0  Worry too much - different things 0 0 0 0  Trouble relaxing 0 0 0 0  Restless 0 0 0 0  Easily annoyed or irritable 0 0 0 0  Afraid - awful might happen 0 0 0 0  Total GAD 7 Score 0 0 0 0  Anxiety Difficulty Not difficult at all Not difficult at all Not difficult at all Not difficult at all    BP Readings from Last 3 Encounters:  08/04/20 118/68  03/25/20 110/75  03/03/20 124/76    Physical Exam Vitals and nursing note reviewed.  Constitutional:      General: She is not in acute distress.    Appearance: Normal appearance. She is well-developed.  HENT:     Head: Normocephalic and atraumatic.     Right Ear: Tympanic membrane is retracted. Tympanic membrane is not erythematous.     Left Ear: Tympanic membrane is retracted. Tympanic membrane is not erythematous.     Nose:     Right Sinus: No maxillary sinus tenderness or frontal sinus tenderness.     Left Sinus: No maxillary sinus tenderness or frontal sinus tenderness.     Mouth/Throat:     Pharynx: Oropharynx is clear.  Cardiovascular:     Rate and Rhythm: Normal rate and regular rhythm.     Pulses: Normal pulses.     Heart sounds: No murmur heard.   Pulmonary:     Effort: Pulmonary effort is normal. No respiratory distress.     Breath sounds: No wheezing or rhonchi.  Musculoskeletal:     Cervical back: Normal range of motion.  Lymphadenopathy:     Cervical: No cervical adenopathy.  Skin:    General: Skin is warm and dry.     Findings: No rash.  Neurological:     Mental Status: She is alert and oriented to  person, place, and time.  Psychiatric:        Mood and Affect: Mood normal.        Behavior: Behavior normal.     Wt Readings from Last 3 Encounters:  08/04/20 234 lb (106.1 kg)  03/25/20 239 lb 3.2 oz (108.5 kg)  03/03/20 236 lb 6.4 oz (107.2 kg)    BP 118/68   Pulse (!) 101   Ht 4\' 11"  (1.499 m)   Wt 234 lb (106.1 kg)   SpO2 97%   BMI 47.26 kg/m   Assessment and Plan:  1. Vertigo Continue to remain hydrated Use Coricidin HBP nightly for 5 days  2. Essential (primary) hypertension Clinically stable exam with well controlled BP. Tolerating medications without side effects at this time. Pt to continue current regimen and low sodium diet; benefits of regular exercise as able discussed.  3. OSA on CPAP Not yet started on CPAP - she plans to get that soon.    Partially dictated using Animal nutritionist. Any errors are unintentional.  Bari Edward, MD [facility]  08/04/2020

## 2020-09-24 ENCOUNTER — Encounter: Payer: Self-pay | Admitting: Internal Medicine

## 2020-09-26 NOTE — Progress Notes (Signed)
Date:  09/27/2020   Name:  Nicole Vargas   DOB:  1970/06/17   MRN:  [id number]   Chief Complaint: Annual Exam (Breast Exam. No pap. ) and Sleep Apnea (Face to face for insurance for OSA/ CPAP follow up.)  Nicole Vargas is a 50 y.o. female who presents today for her Complete Annual Exam. She feels well. She reports exercising none. She reports she is sleeping well now on CPAP. Breast complaints none.  Mammogram:04/28/20 Pap smear: 08/2016 neg with cotesting Colonoscopy: not due  Immunization History  Administered Date(s) Administered  . Influenza,inj,Quad PF,6+ Mos 02/23/2017  . PFIZER(Purple Top)SARS-COV-2 Vaccination 07/25/2019, 08/19/2019    Hypertension This is a chronic problem. The problem is unchanged. The problem is controlled. Pertinent negatives include no chest pain, headaches, palpitations or shortness of breath. Past treatments include ACE inhibitors and diuretics. The current treatment provides significant improvement.  CPAP - started on CPAP 6 months ago.  Per insurance, needs face to face to document use and response.  She is sleeping well, has more energy, no headaches or dizzy spells.  Compliance is good - 30/30 nights minimum 6 hours.  Results of the Epworth flowsheet 09/27/2020 03/03/2020  Sitting and reading 1 0  Watching TV 1 3  Sitting, inactive in a public place (e.g. a theatre or a meeting) 0 2  As a passenger in a car for an hour without a break 0 3  Lying down to rest in the afternoon when circumstances permit 0 3  Sitting and talking to someone 0 0  Sitting quietly after a lunch without alcohol 0 1  In a car, while stopped for a few minutes in traffic 0 0  Total score 2 12    Lab Results  Component Value Date   CREATININE 0.61 09/19/2019   BUN 12 09/19/2019   NA 137 09/19/2019   K 4.7 09/19/2019   CL 103 09/19/2019   CO2 20 09/19/2019   Lab Results  Component Value Date   CHOL 228 (H) 09/19/2019   HDL 57 09/19/2019    LDLCALC 144 (H) 09/19/2019   TRIG 154 (H) 09/19/2019   CHOLHDL 4.0 09/19/2019   Lab Results  Component Value Date   TSH 1.230 09/19/2019   Lab Results  Component Value Date   HGBA1C 5.6 09/19/2019   Lab Results  Component Value Date   WBC 6.6 09/19/2019   HGB 14.8 09/19/2019   HCT 44.1 09/19/2019   MCV 90 09/19/2019   PLT 280 09/19/2019   Lab Results  Component Value Date   ALT 13 09/19/2019   AST 17 09/19/2019   ALKPHOS 32 (L) 09/19/2019   BILITOT 0.5 09/19/2019   Last vitamin D Lab Results  Component Value Date   VD25OH 34.8 10/12/2015      Review of Systems  Constitutional: Negative for chills, fatigue and fever.  HENT: Negative for congestion, hearing loss, tinnitus, trouble swallowing and voice change.   Eyes: Negative for visual disturbance.  Respiratory: Negative for cough, chest tightness, shortness of breath and wheezing.   Cardiovascular: Negative for chest pain, palpitations and leg swelling.  Gastrointestinal: Negative for abdominal pain, constipation, diarrhea and vomiting.  Endocrine: Negative for polydipsia and polyuria.  Genitourinary: Negative for dysuria, frequency, genital sores, vaginal bleeding and vaginal discharge.  Musculoskeletal: Negative for arthralgias, gait problem and joint swelling.  Skin: Negative for color change and rash.  Neurological: Negative for dizziness, tremors, light-headedness and headaches.  Hematological: Negative for adenopathy. Does  not bruise/bleed easily.  Psychiatric/Behavioral: Negative for dysphoric mood and sleep disturbance. The patient is not nervous/anxious.     Patient Active Problem List   Diagnosis Date Noted  . OSA on CPAP 08/04/2020  . Hx of basal cell carcinoma 03/03/2020  . Sleep disorder 03/03/2020  . Perimenopausal symptoms 09/19/2019  . Irritable bowel syndrome with diarrhea 01/21/2019  . Migraine without aura and without status migrainosus, not intractable 09/12/2017  . Arthritis of ankle,  right 02/23/2017  . S/P breast biopsy, left 10/11/2016  . Hyperlipidemia, mixed 08/24/2016  . Plantar fasciitis, bilateral 04/14/2016  . Iron deficiency 07/05/2015  . Vitamin D deficiency 11/24/2014  . Prediabetes 11/24/2014  . Essential (primary) hypertension 08/06/2014  . Obesity, Class III, BMI 40-49.9 (morbid obesity) (HCC) 08/06/2014    Allergies  Allergen Reactions  . Latex Rash    Past Surgical History:  Procedure Laterality Date  . BREAST BIOPSY Left 10/09/2016   Afffirm Biopsy- bx/clip-neg  . CESAREAN SECTION  03/04/2000  . CHOLECYSTECTOMY  2006  . TUBAL LIGATION  2006    Social History   Tobacco Use  . Smoking status: Never Smoker  . Smokeless tobacco: Never Used  Vaping Use  . Vaping Use: Never used  Substance Use Topics  . Alcohol use: No    Alcohol/week: 0.0 standard drinks  . Drug use: No     Medication list has been reviewed and updated.  Current Meds  Medication Sig  . Cholecalciferol (VITAMIN D) 2000 units CAPS Take 1 capsule (2,000 Units total) by mouth daily.  . ferrous sulfate 325 (65 FE) MG EC tablet Take 1 tablet (325 mg total) by mouth daily with breakfast.  . lisinopril-hydrochlorothiazide (ZESTORETIC) 20-25 MG tablet TAKE 1 TABLET BY MOUTH EVERY DAY  . Loperamide HCl (IMODIUM PO) Take by mouth.  . [DISCONTINUED] cholestyramine (QUESTRAN) 4 g packet Take 1 packet (4 g total) by mouth 3 (three) times daily with meals.    PHQ 2/9 Scores 08/04/2020 03/03/2020 11/18/2019 09/19/2019  PHQ - 2 Score 0 0 0 0  PHQ- 9 Score 0 0 0 0    GAD 7 : Generalized Anxiety Score 08/04/2020 08/04/2020 03/03/2020 11/18/2019  Nervous, Anxious, on Edge 1 0 0 0  Control/stop worrying 1 0 0 0  Worry too much - different things 1 0 0 0  Trouble relaxing 1 0 0 0  Restless 1 0 0 0  Easily annoyed or irritable 0 0 0 0  Afraid - awful might happen 0 0 0 0  Total GAD 7 Score 5 0 0 0  Anxiety Difficulty Not difficult at all Not difficult at all Not difficult at all Not  difficult at all    BP Readings from Last 3 Encounters:  09/27/20 128/70  08/04/20 118/68  03/25/20 110/75    Physical Exam Vitals and nursing note reviewed.  Constitutional:      General: She is not in acute distress.    Appearance: She is well-developed.  HENT:     Head: Normocephalic and atraumatic.     Right Ear: Tympanic membrane and ear canal normal.     Left Ear: Tympanic membrane and ear canal normal.     Nose:     Right Sinus: No maxillary sinus tenderness.     Left Sinus: No maxillary sinus tenderness.  Eyes:     General: No scleral icterus.       Right eye: No discharge.        Left eye: No discharge.  Conjunctiva/sclera: Conjunctivae normal.  Neck:     Thyroid: No thyromegaly.     Vascular: No carotid bruit.  Cardiovascular:     Rate and Rhythm: Normal rate and regular rhythm.     Pulses: Normal pulses.     Heart sounds: Normal heart sounds.  Pulmonary:     Effort: Pulmonary effort is normal. No respiratory distress.     Breath sounds: No wheezing.  Chest:  Breasts:     Right: No mass, nipple discharge, skin change or tenderness.     Left: No mass, nipple discharge, skin change or tenderness.    Abdominal:     General: Bowel sounds are normal.     Palpations: Abdomen is soft.     Tenderness: There is no abdominal tenderness.  Musculoskeletal:     Cervical back: Normal range of motion. No erythema.     Right lower leg: No edema.     Left lower leg: No edema.  Lymphadenopathy:     Cervical: No cervical adenopathy.  Skin:    General: Skin is warm and dry.     Findings: No rash.  Neurological:     Mental Status: She is alert and oriented to person, place, and time.     Cranial Nerves: No cranial nerve deficit.     Sensory: No sensory deficit.     Deep Tendon Reflexes: Reflexes are normal and symmetric.  Psychiatric:        Attention and Perception: Attention normal.        Mood and Affect: Mood normal.     Wt Readings from Last 3  Encounters:  09/27/20 234 lb (106.1 kg)  08/04/20 234 lb (106.1 kg)  03/25/20 239 lb 3.2 oz (108.5 kg)    BP 128/70   Pulse 76   Ht 4\' 11"  (1.499 m)   Wt 234 lb (106.1 kg)   LMP 09/27/2020 (Exact Date)   SpO2 98%   BMI 47.26 kg/m   Assessment and Plan: 1. Annual physical exam Exam is normal except for weight. Encourage regular exercise and appropriate dietary changes. Mammogram done recently. Pt wants to postpone colonoscopy at this time.  2. Essential (primary) hypertension Clinically stable exam with well controlled BP. Tolerating medications without side effects at this time. Pt to continue current regimen and low sodium diet; benefits of regular exercise as able discussed. - CBC with Differential/Platelet - POCT urinalysis dipstick - TSH - lisinopril-hydrochlorothiazide (ZESTORETIC) 20-25 MG tablet; Take 1 tablet by mouth daily.  Dispense: 90 tablet; Refill: 1  3. OSA on CPAP Excellent response and compliance with CPAP therapy. She has used it the last 30/30 days with average nightly use 6 hours.   Her mask fit is good with minimal leak. Continue current settings  4. Prediabetes Continue to work on low carb diet; exercise encouraged - Comprehensive metabolic panel - Hemoglobin A1c  5. Hyperlipidemia, mixed Check labs and advise - Lipid panel  6. Need for hepatitis C screening test - Hepatitis C antibody  7. Obesity, Class III, BMI 40-49.9 (morbid obesity) (HCC) Diet and exercise reinforced.   Partially dictated using 11/27/2020. Any errors are unintentional.  Animal nutritionist, MD [facility]  09/27/2020

## 2020-09-27 ENCOUNTER — Encounter: Payer: Self-pay | Admitting: Internal Medicine

## 2020-09-27 ENCOUNTER — Ambulatory Visit (INDEPENDENT_AMBULATORY_CARE_PROVIDER_SITE_OTHER): Payer: Commercial Managed Care - PPO | Admitting: Internal Medicine

## 2020-09-27 ENCOUNTER — Other Ambulatory Visit: Payer: Self-pay

## 2020-09-27 VITALS — BP 128/70 | HR 76 | Ht 59.0 in | Wt 234.0 lb

## 2020-09-27 DIAGNOSIS — Z1159 Encounter for screening for other viral diseases: Secondary | ICD-10-CM | POA: Diagnosis not present

## 2020-09-27 DIAGNOSIS — E782 Mixed hyperlipidemia: Secondary | ICD-10-CM

## 2020-09-27 DIAGNOSIS — I10 Essential (primary) hypertension: Secondary | ICD-10-CM

## 2020-09-27 DIAGNOSIS — Z0000 Encounter for general adult medical examination without abnormal findings: Secondary | ICD-10-CM

## 2020-09-27 DIAGNOSIS — G4733 Obstructive sleep apnea (adult) (pediatric): Secondary | ICD-10-CM | POA: Diagnosis not present

## 2020-09-27 DIAGNOSIS — Z9989 Dependence on other enabling machines and devices: Secondary | ICD-10-CM

## 2020-09-27 DIAGNOSIS — Z1231 Encounter for screening mammogram for malignant neoplasm of breast: Secondary | ICD-10-CM

## 2020-09-27 DIAGNOSIS — R7303 Prediabetes: Secondary | ICD-10-CM

## 2020-09-27 LAB — POCT URINALYSIS DIPSTICK
Bilirubin, UA: NEGATIVE
Glucose, UA: NEGATIVE
Ketones, UA: NEGATIVE
Leukocytes, UA: NEGATIVE
Nitrite, UA: NEGATIVE
Protein, UA: NEGATIVE
Spec Grav, UA: 1.015 (ref 1.010–1.025)
Urobilinogen, UA: 0.2 E.U./dL
pH, UA: 5 (ref 5.0–8.0)

## 2020-09-27 MED ORDER — LISINOPRIL-HYDROCHLOROTHIAZIDE 20-25 MG PO TABS: 1.0000 | ORAL_TABLET | Freq: Every day | ORAL | 1 refills | Status: DC

## 2020-09-28 LAB — LIPID PANEL
Chol/HDL Ratio: 4 ratio (ref 0.0–4.4)
Cholesterol, Total: 226 mg/dL — ABNORMAL HIGH (ref 100–199)
HDL: 57 mg/dL (ref >39)
LDL Chol Calc (NIH): 140 mg/dL — ABNORMAL HIGH (ref 0–99)
Triglycerides: 165 mg/dL — ABNORMAL HIGH (ref 0–149)
VLDL Cholesterol Cal: 29 mg/dL (ref 5–40)

## 2020-09-28 LAB — HEPATITIS C ANTIBODY: Hep C Virus Ab: <0.1 s/co ratio (ref 0.0–0.9)

## 2020-09-28 LAB — CBC WITH DIFFERENTIAL/PLATELET
Basophils Absolute: 0.1 10*3/uL (ref 0.0–0.2)
Basos: 1 %
EOS (ABSOLUTE): 0.2 10*3/uL (ref 0.0–0.4)
Eos: 3 %
Hematocrit: 42.9 % (ref 34.0–46.6)
Hemoglobin: 13.9 g/dL (ref 11.1–15.9)
Immature Grans (Abs): 0 10*3/uL (ref 0.0–0.1)
Immature Granulocytes: 0 %
Lymphocytes Absolute: 1.8 10*3/uL (ref 0.7–3.1)
Lymphs: 24 %
MCH: 28.9 pg (ref 26.6–33.0)
MCHC: 32.4 g/dL (ref 31.5–35.7)
MCV: 89 fL (ref 79–97)
Monocytes Absolute: 0.5 10*3/uL (ref 0.1–0.9)
Monocytes: 7 %
Neutrophils Absolute: 4.9 10*3/uL (ref 1.4–7.0)
Neutrophils: 65 %
Platelets: 306 10*3/uL (ref 150–450)
RBC: 4.81 x10E6/uL (ref 3.77–5.28)
RDW: 11.8 % (ref 11.7–15.4)
WBC: 7.5 10*3/uL (ref 3.4–10.8)

## 2020-09-28 LAB — COMPREHENSIVE METABOLIC PANEL
ALT: 12 IU/L (ref 0–32)
AST: 14 IU/L (ref 0–40)
Albumin/Globulin Ratio: 1.5 (ref 1.2–2.2)
Albumin: 4.2 g/dL (ref 3.8–4.8)
Alkaline Phosphatase: 37 IU/L — ABNORMAL LOW (ref 44–121)
BUN/Creatinine Ratio: 21 (ref 9–23)
BUN: 13 mg/dL (ref 6–24)
Bilirubin Total: 0.4 mg/dL (ref 0.0–1.2)
CO2: 19 mmol/L — ABNORMAL LOW (ref 20–29)
Calcium: 9.6 mg/dL (ref 8.7–10.2)
Chloride: 102 mmol/L (ref 96–106)
Creatinine, Ser: 0.62 mg/dL (ref 0.57–1.00)
Globulin, Total: 2.8 g/dL (ref 1.5–4.5)
Glucose: 92 mg/dL (ref 65–99)
Potassium: 4.5 mmol/L (ref 3.5–5.2)
Sodium: 139 mmol/L (ref 134–144)
Total Protein: 7 g/dL (ref 6.0–8.5)
eGFR: 110 mL/min/{1.73_m2} (ref >59)

## 2020-09-28 LAB — HEMOGLOBIN A1C
Est. average glucose Bld gHb Est-mCnc: 126 mg/dL
Hgb A1c MFr Bld: 6 % — ABNORMAL HIGH (ref 4.8–5.6)

## 2020-09-28 LAB — TSH: TSH: 1.06 u[IU]/mL (ref 0.450–4.500)

## 2020-11-07 ENCOUNTER — Ambulatory Visit
Admission: EM | Admit: 2020-11-07 | Discharge: 2020-11-07 | Disposition: A | Discharge status: Home / Self Care | Payer: Commercial Managed Care - PPO | Attending: Family Medicine | Admitting: Family Medicine

## 2020-11-07 ENCOUNTER — Other Ambulatory Visit: Payer: Self-pay

## 2020-11-07 ENCOUNTER — Encounter: Payer: Self-pay | Admitting: Emergency Medicine

## 2020-11-07 DIAGNOSIS — L259 Unspecified contact dermatitis, unspecified cause: Secondary | ICD-10-CM

## 2020-11-07 MED ORDER — TRIAMCINOLONE ACETONIDE 0.1 % EX OINT: 1.0000 "application " | TOPICAL_OINTMENT | Freq: Two times a day (BID) | CUTANEOUS | 0 refills | Status: DC

## 2020-11-07 NOTE — ED Provider Notes (Signed)
[facility]    CSN: [id number] Arrival date & time: 11/07/20  0815      History   Chief Complaint Chief Complaint  Patient presents with   Rash    HPI 50 year old female presents with rash.  Patient reports slightly raised rash on her forearms bilaterally.  Started initially on July 3 but then seemed to resolve and has now recurred.  Patient states that she has recently changed some laundry detergent and is unsure this is contributing.  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  No itching.  No significant pain.  No known relieving factors.  No other complaints.   Past Medical History:  Diagnosis Date   Abscess    Hx of basal cell carcinoma 03/03/2020   Hypertension    Migraine     Patient Active Problem List   Diagnosis Date Noted   OSA on CPAP 08/04/2020   Hx of basal cell carcinoma 03/03/2020   Sleep disorder 03/03/2020   Perimenopausal symptoms 09/19/2019   Irritable bowel syndrome with diarrhea 01/21/2019   Migraine without aura and without status migrainosus, not intractable 09/12/2017   Arthritis of ankle, right 02/23/2017   S/P breast biopsy, left 10/11/2016   Hyperlipidemia, mixed 08/24/2016   Plantar fasciitis, bilateral 04/14/2016   Iron deficiency 07/05/2015   Vitamin D deficiency 11/24/2014   Prediabetes 11/24/2014   Essential (primary) hypertension 08/06/2014   Obesity, Class III, BMI 40-49.9 (morbid obesity) (HCC) 08/06/2014    Past Surgical History:  Procedure Laterality Date   BREAST BIOPSY Left 10/09/2016   Afffirm Biopsy- bx/clip-neg   CESAREAN SECTION  03/04/2000   CHOLECYSTECTOMY  2006   TUBAL LIGATION  2006    OB History   No obstetric history on file.      Home Medications    Prior to Admission medications   Medication Sig Start Date End Date Taking? Authorizing Provider  Cholecalciferol (VITAMIN D) 2000 units CAPS Take 1 capsule (2,000 Units total) by mouth  daily. 07/05/15  Yes Plonk, Chrissie Noa, MD  ferrous sulfate 325 (65 FE) MG EC tablet Take 1 tablet (325 mg total) by mouth daily with breakfast. 07/05/15  Yes Plonk, Chrissie Noa, MD  lisinopril-hydrochlorothiazide (ZESTORETIC) 20-25 MG tablet Take 1 tablet by mouth daily. 09/27/20  Yes Reubin Milan, MD  triamcinolone ointment (KENALOG) 0.1 % Apply 1 application topically 2 (two) times daily. 11/07/20  Yes Kyel Purk G, DO  Loperamide HCl (IMODIUM PO) Take by mouth.    [provider]    Family History Family History  Problem Relation Age of Onset   Stroke Father    Diabetes Maternal Grandmother    Heart disease Paternal Grandfather    Breast cancer Maternal Aunt 2       aunt and great aunt    Social History Social History   Tobacco Use   Smoking status: Never   Smokeless tobacco: Never  Vaping Use   Vaping Use: Never used  Substance Use Topics   Alcohol use: No    Alcohol/week: 0.0 standard drinks   Drug use: No     Allergies   Latex   Review of Systems Review of Systems  Constitutional: Negative.   Skin:  Positive for rash.   Physical Exam Triage Vital Signs ED Triage Vitals  Enc Vitals Group     BP 11/07/20 0826 114/76     Pulse Rate 11/07/20 0826 83     Resp 11/07/20 0826 14  Temp 11/07/20 0826 98.8 F (37.1 C)     Temp Source 11/07/20 0826 Oral     SpO2 11/07/20 0826 100 %     Weight 11/07/20 0823 230 lb (104.3 kg)     Height 11/07/20 0823 4\' 11"  (1.499 m)     Head Circumference --      Peak Flow --      Pain Score 11/07/20 0823 0     Pain Loc --      Pain Edu? --      Excl. in GC? --    Updated Vital Signs BP 114/76 (BP Location: Left Arm)   Pulse 83   Temp 98.8 F (37.1 C) (Oral)   Resp 14   Ht 4\' 11"  (1.499 m)   Wt 104.3 kg   LMP 10/22/2020 (Approximate)   SpO2 100%   BMI 46.45 kg/m   Visual Acuity Right Eye Distance:   Left Eye Distance:   Bilateral Distance:    Right Eye Near:   Left Eye Near:    Bilateral Near:      Physical Exam Constitutional:      General: She is not in acute distress.    Appearance: Normal appearance. She is obese. She is not ill-appearing.  HENT:     Head: Normocephalic and atraumatic.  Pulmonary:     Effort: Pulmonary effort is normal. No respiratory distress.  Skin:    Comments: Volar forearms with a few erythematous papules.  Neurological:     Mental Status: She is alert.  Psychiatric:        Mood and Affect: Mood normal.        Behavior: Behavior normal.     UC Treatments / Results  Labs (all labs ordered are listed, but only abnormal results are displayed) Labs Reviewed - No data to display  EKG   Radiology No results found.  Procedures Procedures (including critical care time)  Medications Ordered in UC Medications - No data to display  Initial Impression / Assessment and Plan / UC Course  I have reviewed the triage vital signs and the nursing notes.  Pertinent labs & imaging results that were available during my care of the patient were reviewed by me and considered in my medical decision making (see chart for details).    50 year old female presents with contact dermatitis.  Triamcinolone as directed.  Final Clinical Impressions(s) / UC Diagnoses   Final diagnoses:  Contact dermatitis, unspecified contact dermatitis type, unspecified trigger   Discharge Instructions   None    ED Prescriptions     Medication Sig Dispense Auth. Provider   triamcinolone ointment (KENALOG) 0.1 % Apply 1 application topically 2 (two) times daily. 30 g 12/23/2020, DO      PDMP not reviewed this encounter.   [address], [place] Other 11/07/20 [phone]

## 2020-11-07 NOTE — ED Triage Notes (Signed)
Patient c/o red bumps on her arms that started off and on since July 4.

## 2021-02-27 ENCOUNTER — Other Ambulatory Visit: Payer: Self-pay

## 2021-02-27 ENCOUNTER — Ambulatory Visit
Admission: EM | Admit: 2021-02-27 | Discharge: 2021-02-27 | Disposition: A | Discharge status: Home / Self Care | Payer: Commercial Managed Care - PPO | Attending: Internal Medicine | Admitting: Internal Medicine

## 2021-02-27 ENCOUNTER — Encounter: Payer: Self-pay | Admitting: Emergency Medicine

## 2021-02-27 DIAGNOSIS — Z20822 Contact with and (suspected) exposure to covid-19: Secondary | ICD-10-CM | POA: Insufficient documentation

## 2021-02-27 DIAGNOSIS — R051 Acute cough: Secondary | ICD-10-CM | POA: Insufficient documentation

## 2021-02-27 DIAGNOSIS — J029 Acute pharyngitis, unspecified: Secondary | ICD-10-CM | POA: Diagnosis present

## 2021-02-27 DIAGNOSIS — R0982 Postnasal drip: Secondary | ICD-10-CM | POA: Insufficient documentation

## 2021-02-27 MED ORDER — FLUTICASONE PROPIONATE 50 MCG/ACT NA SUSP: 1.0000 | Freq: Every day | NASAL | 0 refills | Status: AC

## 2021-02-27 NOTE — ED Provider Notes (Signed)
[facility]    CSN: [id number] Arrival date & time: 02/27/21  0825      History   Chief Complaint Chief Complaint  Patient presents with   Cough   Sore Throat    HPI Nicole Vargas is a 50 y.o. female comes to the urgent care with 3-day history of nonproductive cough, sore throat and postnasal drainage.  Patient's symptoms started insidiously and has worsened over the past few days.  She denies any shortness of breath.  No itchy eyes, nose or ears.  She feels fullness in the ears.  No chest pain or chest pressure.  Patient works at a [employer] and endorses exposure to environmental substances.  No wheezing.  No recent sick contacts.  Patient is vaccinated against COVID-19 virus.  She denies any body aches.  No vomiting or diarrhea.   HPI  Past Medical History:  Diagnosis Date   Abscess    Hx of basal cell carcinoma 03/03/2020   Hypertension    Migraine     Patient Active Problem List   Diagnosis Date Noted   OSA on CPAP 08/04/2020   Hx of basal cell carcinoma 03/03/2020   Sleep disorder 03/03/2020   Perimenopausal symptoms 09/19/2019   Irritable bowel syndrome with diarrhea 01/21/2019   Migraine without aura and without status migrainosus, not intractable 09/12/2017   Arthritis of ankle, right 02/23/2017   S/P breast biopsy, left 10/11/2016   Hyperlipidemia, mixed 08/24/2016   Plantar fasciitis, bilateral 04/14/2016   Iron deficiency 07/05/2015   Vitamin D deficiency 11/24/2014   Prediabetes 11/24/2014   Essential (primary) hypertension 08/06/2014   Obesity, Class III, BMI 40-49.9 (morbid obesity) ([place]) 08/06/2014    Past Surgical History:  Procedure Laterality Date   BREAST BIOPSY Left 10/09/2016   Afffirm Biopsy- bx/clip-neg   CESAREAN SECTION  03/04/2000   CHOLECYSTECTOMY  2006   TUBAL LIGATION  2006    OB History   No obstetric history on file.      Home Medications    Prior to Admission medications   Medication Sig Start  Date End Date Taking? Authorizing Provider  Cholecalciferol (VITAMIN D) 2000 units CAPS Take 1 capsule (2,000 Units total) by mouth daily. 07/05/15  Yes Plonk, Gwyndolyn Saxon, MD  ferrous sulfate 325 (65 FE) MG EC tablet Take 1 tablet (325 mg total) by mouth daily with breakfast. 07/05/15  Yes Plonk, Gwyndolyn Saxon, MD  fluticasone (FLONASE) 50 MCG/ACT nasal spray Place 1 spray into both nostrils daily. 02/27/21  Yes Palmina Clodfelter, Myrene Galas, MD  lisinopril-hydrochlorothiazide (ZESTORETIC) 20-25 MG tablet Take 1 tablet by mouth daily. 09/27/20  Yes Glean Hess, MD  Loperamide HCl (IMODIUM PO) Take by mouth.    [provider]  triamcinolone ointment (KENALOG) 0.1 % Apply 1 application topically 2 (two) times daily. 11/07/20   Coral Spikes, DO    Family History Family History  Problem Relation Age of Onset   Stroke Father    Diabetes Maternal Grandmother    Heart disease Paternal Grandfather    Breast cancer Maternal Aunt 70       aunt and great aunt    Social History Social History   Tobacco Use   Smoking status: Never   Smokeless tobacco: Never  Vaping Use   Vaping Use: Never used  Substance Use Topics   Alcohol use: No    Alcohol/week: 0.0 standard drinks   Drug use: No     Allergies   Latex   Review of Systems  Review of Systems  Constitutional:  Negative for chills, fatigue and fever.  HENT:  Positive for congestion, postnasal drip, rhinorrhea and sore throat. Negative for dental problem.   Respiratory:  Positive for cough. Negative for shortness of breath and wheezing.   Cardiovascular:  Negative for chest pain.  Gastrointestinal: Negative.     Physical Exam Triage Vital Signs ED Triage Vitals  Enc Vitals Group     BP 02/27/21 0915 104/71     Pulse Rate 02/27/21 0915 71     Resp 02/27/21 0915 14     Temp 02/27/21 0915 98.3 F (36.8 C)     Temp Source 02/27/21 0915 Oral     SpO2 02/27/21 0915 99 %     Weight 02/27/21 0913 231 lb (104.8 kg)     Height 02/27/21 0913  4\' 11"  (1.499 m)     Head Circumference --      Peak Flow --      Pain Score 02/27/21 0913 0     Pain Loc --      Pain Edu? --      Excl. in GC? --    No data found.  Updated Vital Signs BP 104/71 (BP Location: Right Arm)   Pulse 71   Temp 98.3 F (36.8 C) (Oral)   Resp 14   Ht 4\' 11"  (1.499 m)   Wt 104.8 kg   LMP 01/28/2021 (Approximate)   SpO2 99%   BMI 46.66 kg/m   Visual Acuity Right Eye Distance:   Left Eye Distance:   Bilateral Distance:    Right Eye Near:   Left Eye Near:    Bilateral Near:     Physical Exam Vitals and nursing note reviewed.  HENT:     Right Ear: Tympanic membrane normal.     Left Ear: Tympanic membrane normal.     Mouth/Throat:     Mouth: Mucous membranes are moist.     Pharynx: No oropharyngeal exudate or posterior oropharyngeal erythema.     Tonsils: No tonsillar exudate or tonsillar abscesses. 1+ on the right. 1+ on the left.  Cardiovascular:     Rate and Rhythm: Normal rate and regular rhythm.  Pulmonary:     Effort: Pulmonary effort is normal.     Breath sounds: Normal breath sounds.  Musculoskeletal:     Cervical back: Normal range of motion and neck supple.     UC Treatments / Results  Labs (all labs ordered are listed, but only abnormal results are displayed) Labs Reviewed  SARS CORONAVIRUS 2 (TAT 6-24 HRS)    EKG   Radiology No results found.  Procedures Procedures (including critical care time)  Medications Ordered in UC Medications - No data to display  Initial Impression / Assessment and Plan / UC Course  I have reviewed the triage vital signs and the nursing notes.  Pertinent labs & imaging results that were available during my care of the patient were reviewed by me and considered in my medical decision making (see chart for details).     1.  Postnasal drainage with sore throat: COVID-19 PCR test has been sent Flonase Warm salt water gargle Tylenol/Motrin as needed for fever Return to urgent care  if symptoms worsen We will call you with recommendations if labs are abnormal.  Final Clinical Impressions(s) / UC Diagnoses   Final diagnoses:  Post-nasal drainage  Sorethroat     Discharge Instructions      Warm salt water gargle Take medications as prescribed  Return to urgent care if symptom worsens We will call you with recommendations if labs are abnormal You can review your COVID test results through MyChart.     ED Prescriptions     Medication Sig Dispense Auth. Provider   fluticasone (FLONASE) 50 MCG/ACT nasal spray Place 1 spray into both nostrils daily. 16 g Rokia Bosket, Britta Mccreedy, MD      PDMP not reviewed this encounter.   Merrilee Jansky, MD 02/27/21 1049

## 2021-02-27 NOTE — Discharge Instructions (Addendum)
Warm salt water gargle Take medications as prescribed Return to urgent care if symptom worsens We will call you with recommendations if labs are abnormal You can review your COVID test results through MyChart.

## 2021-02-27 NOTE — ED Triage Notes (Signed)
Patient c/o cough, sore throat, and runny nose on Thursday.  Patient denies fevers.

## 2021-02-28 LAB — SARS CORONAVIRUS 2 (TAT 6-24 HRS): SARS Coronavirus 2: NEGATIVE

## 2021-03-09 ENCOUNTER — Other Ambulatory Visit: Payer: Self-pay

## 2021-03-09 ENCOUNTER — Encounter: Payer: Self-pay | Admitting: Internal Medicine

## 2021-03-09 ENCOUNTER — Ambulatory Visit: Payer: Commercial Managed Care - PPO | Admitting: Internal Medicine

## 2021-03-09 VITALS — BP 118/70 | HR 80 | Ht 59.0 in | Wt 235.0 lb

## 2021-03-09 DIAGNOSIS — T753XXD Motion sickness, subsequent encounter: Secondary | ICD-10-CM | POA: Diagnosis not present

## 2021-03-09 DIAGNOSIS — G4733 Obstructive sleep apnea (adult) (pediatric): Secondary | ICD-10-CM | POA: Diagnosis not present

## 2021-03-09 DIAGNOSIS — F40240 Claustrophobia: Secondary | ICD-10-CM

## 2021-03-09 DIAGNOSIS — I10 Essential (primary) hypertension: Secondary | ICD-10-CM | POA: Diagnosis not present

## 2021-03-09 DIAGNOSIS — Z9989 Dependence on other enabling machines and devices: Secondary | ICD-10-CM

## 2021-03-09 DIAGNOSIS — R7303 Prediabetes: Secondary | ICD-10-CM | POA: Diagnosis not present

## 2021-03-09 DIAGNOSIS — T753XXA Motion sickness, initial encounter: Secondary | ICD-10-CM | POA: Insufficient documentation

## 2021-03-09 LAB — POCT GLYCOSYLATED HEMOGLOBIN (HGB A1C): Hemoglobin A1C: 5.6 % (ref 4.0–5.6)

## 2021-03-09 MED ORDER — LISINOPRIL-HYDROCHLOROTHIAZIDE 20-25 MG PO TABS: 1.0000 | ORAL_TABLET | Freq: Every day | ORAL | 1 refills | Status: DC

## 2021-03-09 NOTE — Progress Notes (Signed)
Date:  03/09/2021   Name:  Nicole Vargas   DOB:  03/19/1971   MRN:  [id number]   Chief Complaint: Diabetes and Hypertension  Hypertension This is a chronic problem. The problem is controlled. Pertinent negatives include no chest pain, headaches, palpitations or shortness of breath. Past treatments include ACE inhibitors and diuretics. There is no history of kidney disease, CAD/MI or CVA.  Diabetes She presents for her follow-up diabetic visit. Diabetes type: prediabetes. Pertinent negatives for hypoglycemia include no dizziness or headaches. Pertinent negatives for diabetes include no chest pain, no fatigue and no weakness. Symptoms are stable. Pertinent negatives for diabetic complications include no CVA.  Motion sickness - she has issues with vertigo and movement of the line at work makes her dizzy and nauseated.  She would like to have a work note to excuse her from this position. Claustrophobia - she can normally avoid small spaces but she has to use the turnstile doors at work.  This creates some anxiety and would like to be able to use a regular door.  Lab Results  Component Value Date   CREATININE 0.62 09/27/2020   BUN 13 09/27/2020   NA 139 09/27/2020   K 4.5 09/27/2020   CL 102 09/27/2020   CO2 19 (L) 09/27/2020   Lab Results  Component Value Date   CHOL 226 (H) 09/27/2020   HDL 57 09/27/2020   LDLCALC 140 (H) 09/27/2020   TRIG 165 (H) 09/27/2020   CHOLHDL 4.0 09/27/2020   Lab Results  Component Value Date   TSH 1.060 09/27/2020   Lab Results  Component Value Date   HGBA1C 5.6 03/09/2021   Lab Results  Component Value Date   WBC 7.5 09/27/2020   HGB 13.9 09/27/2020   HCT 42.9 09/27/2020   MCV 89 09/27/2020   PLT 306 09/27/2020   Lab Results  Component Value Date   ALT 12 09/27/2020   AST 14 09/27/2020   ALKPHOS 37 (L) 09/27/2020   BILITOT 0.4 09/27/2020     Review of Systems  Constitutional:  Negative for fatigue and unexpected weight  change.  HENT:  Negative for nosebleeds.   Eyes:  Negative for visual disturbance.  Respiratory:  Negative for cough, chest tightness, shortness of breath and wheezing.   Cardiovascular:  Negative for chest pain, palpitations and leg swelling.  Gastrointestinal:  Negative for abdominal pain, constipation and diarrhea.  Neurological:  Negative for dizziness, weakness, light-headedness and headaches.   Patient Active Problem List   Diagnosis Date Noted   Claustrophobia 03/09/2021   Motion sickness 03/09/2021   OSA on CPAP 08/04/2020   Hx of basal cell carcinoma 03/03/2020   Sleep disorder 03/03/2020   Perimenopausal symptoms 09/19/2019   Irritable bowel syndrome with diarrhea 01/21/2019   Migraine without aura and without status migrainosus, not intractable 09/12/2017   Arthritis of ankle, right 02/23/2017   S/P breast biopsy, left 10/11/2016   Hyperlipidemia, mixed 08/24/2016   Plantar fasciitis, bilateral 04/14/2016   Iron deficiency 07/05/2015   Vitamin D deficiency 11/24/2014   Prediabetes 11/24/2014   Essential (primary) hypertension 08/06/2014   Obesity, Class III, BMI 40-49.9 (morbid obesity) ([place]) 08/06/2014    Allergies  Allergen Reactions   Latex Rash    Past Surgical History:  Procedure Laterality Date   BREAST BIOPSY Left 10/09/2016   Afffirm Biopsy- bx/clip-neg   CESAREAN SECTION  03/04/2000   CHOLECYSTECTOMY  2006   TUBAL LIGATION  2006    Social History  Tobacco Use   Smoking status: Never   Smokeless tobacco: Never  Vaping Use   Vaping Use: Never used  Substance Use Topics   Alcohol use: No    Alcohol/week: 0.0 standard drinks   Drug use: No     Medication list has been reviewed and updated.  Current Meds  Medication Sig   Cholecalciferol (VITAMIN D) 2000 units CAPS Take 1 capsule (2,000 Units total) by mouth daily.   ferrous sulfate 325 (65 FE) MG EC tablet Take 1 tablet (325 mg total) by mouth daily with breakfast.   fluticasone  (FLONASE) 50 MCG/ACT nasal spray Place 1 spray into both nostrils daily.   Loperamide HCl (IMODIUM PO) Take by mouth.   triamcinolone ointment (KENALOG) 0.1 % Apply 1 application topically 2 (two) times daily.   [DISCONTINUED] lisinopril-hydrochlorothiazide (ZESTORETIC) 20-25 MG tablet Take 1 tablet by mouth daily.    PHQ 2/9 Scores 03/09/2021 09/27/2020 08/04/2020 03/03/2020  PHQ - 2 Score 1 0 0 0  PHQ- 9 Score 5 2 0 0    GAD 7 : Generalized Anxiety Score 03/09/2021 09/27/2020 08/04/2020 08/04/2020  Nervous, Anxious, on Edge 1 0 1 0  Control/stop worrying 2 0 1 0  Worry too much - different things 2 1 1  0  Trouble relaxing 1 0 1 0  Restless 0 0 1 0  Easily annoyed or irritable 2 0 0 0  Afraid - awful might happen 0 0 0 0  Total GAD 7 Score 8 1 5  0  Anxiety Difficulty Somewhat difficult Not difficult at all Not difficult at all Not difficult at all    BP Readings from Last 3 Encounters:  03/09/21 118/70  02/27/21 104/71  11/07/20 114/76    Physical Exam Vitals and nursing note reviewed.  Constitutional:      General: She is not in acute distress.    Appearance: She is well-developed.  HENT:     Head: Normocephalic and atraumatic.  Cardiovascular:     Rate and Rhythm: Normal rate and regular rhythm.     Pulses: Normal pulses.  Pulmonary:     Effort: Pulmonary effort is normal. No respiratory distress.     Breath sounds: No wheezing or rhonchi.  Musculoskeletal:     Cervical back: Normal range of motion.     Right lower leg: No edema.     Left lower leg: No edema.  Skin:    General: Skin is warm and dry.     Capillary Refill: Capillary refill takes less than 2 seconds.     Findings: No rash.  Neurological:     General: No focal deficit present.     Mental Status: She is alert and oriented to person, place, and time.  Psychiatric:        Mood and Affect: Mood normal.        Behavior: Behavior normal.    Wt Readings from Last 3 Encounters:  03/09/21 235 lb (106.6 kg)   02/27/21 231 lb (104.8 kg)  11/07/20 230 lb (104.3 kg)    BP 118/70   Pulse 80   Ht 4\' 11"  (1.499 m)   Wt 235 lb (106.6 kg)   SpO2 98%   BMI 47.46 kg/m   Assessment and Plan: 1. Essential (primary) hypertension Clinically stable exam with well controlled BP. Tolerating medications without side effects at this time. Pt to continue current regimen and low sodium diet; benefits of regular exercise as able discussed. - lisinopril-hydrochlorothiazide (ZESTORETIC) 20-25 MG tablet; Take 1  tablet by mouth daily.  Dispense: 90 tablet; Refill: 1  2. Prediabetes Continue to limit carbs and sweets Work on diet changes and weight loss - long discussion about diet changes - POCT glycosylated hemoglobin (Hb A1C) = 5.6 down from 6.0  3. OSA on CPAP Doing well with nightly use; sleeps well with good compliance.  4. Motion sickness, subsequent encounter Note given for work.  5. Claustrophobia Note given for work.   Partially dictated using Animal nutritionist. Any errors are unintentional.  Bari Edward, MD [facility]  03/09/2021

## 2021-03-10 ENCOUNTER — Telehealth: Payer: Self-pay

## 2021-03-10 NOTE — Telephone Encounter (Signed)
Spoke with patient and reprinted letter after adjusting words. Dr Judithann Graves will sign in AM and I will fax this to patients husbands e-mail address as she requested.

## 2021-03-10 NOTE — Telephone Encounter (Signed)
Copied from CRM [id number]. Topic: General - Inquiry >> Mar 10, 2021 12:00 PM Nicole Vargas wrote: Reason for CRM: Pt requesting cb from Dr Senaida Lange nurse as the note that was sent regardIing her work needs to be worded different. Pls call pt back stating shd be around 2:00 CB AT [phone].She will be on break,

## 2021-06-28 ENCOUNTER — Ambulatory Visit: Payer: Self-pay | Admitting: *Deleted

## 2021-06-28 ENCOUNTER — Ambulatory Visit: Payer: Commercial Managed Care - PPO | Admitting: Internal Medicine

## 2021-06-28 ENCOUNTER — Other Ambulatory Visit: Payer: Self-pay

## 2021-06-28 ENCOUNTER — Encounter: Payer: Self-pay | Admitting: Internal Medicine

## 2021-06-28 VITALS — BP 132/78 | HR 78 | Ht 59.0 in | Wt 216.0 lb

## 2021-06-28 DIAGNOSIS — R300 Dysuria: Secondary | ICD-10-CM

## 2021-06-28 DIAGNOSIS — N760 Acute vaginitis: Secondary | ICD-10-CM

## 2021-06-28 LAB — POCT WET PREP WITH KOH
KOH Prep POC: NEGATIVE
RBC Wet Prep HPF POC: 2
Trichomonas, UA: NEGATIVE
WBC Wet Prep HPF POC: 0

## 2021-06-28 LAB — POC URINALYSIS WITH MICROSCOPIC (NON AUTO)MANUAL RESULT
Bilirubin, UA: NEGATIVE
Crystals: 0
Glucose, UA: NEGATIVE
Ketones, UA: NEGATIVE
Mucus, UA: 0
Nitrite, UA: NEGATIVE
Protein, UA: NEGATIVE
RBC: 2 M/uL — AB (ref 4.04–5.48)
Spec Grav, UA: 1.01 (ref 1.010–1.025)
Urobilinogen, UA: 0.2 E.U./dL
WBC Casts, UA: 10
pH, UA: 5 (ref 5.0–8.0)

## 2021-06-28 MED ORDER — FLUCONAZOLE 100 MG PO TABS: 100.0000 mg | ORAL_TABLET | Freq: Once | ORAL | 0 refills | Status: AC

## 2021-06-28 MED ORDER — SULFAMETHOXAZOLE-TRIMETHOPRIM 800-160 MG PO TABS: 1.0000 | ORAL_TABLET | Freq: Two times a day (BID) | ORAL | 0 refills | Status: DC

## 2021-06-28 NOTE — Progress Notes (Signed)
? ? ?Date:  06/28/2021  ? ?Name:  Nicole Vargas   DOB:  24-Jan-1971   MRN:  [id number] ? ? ?Chief Complaint: Vaginal Itching ? ?Vaginal Injury ?The patient's primary symptoms include vaginal discharge (scant itching). This is a new problem. The current episode started in the past 7 days. The problem occurs daily. The problem has been waxing and waning. The patient is experiencing no pain. Associated symptoms include frequency and urgency. Pertinent negatives include no chills, dysuria or fever. The vaginal discharge was white and scant. There has been no bleeding.  ?Obesity/prediabetes - she has made changes to her diet, limiting calories and exercising at the gym 4 days per week.  She has lost 20 lbs since January and feels well. ? ?Lab Results  ?Component Value Date  ? NA 139 09/27/2020  ? K 4.5 09/27/2020  ? CO2 19 (L) 09/27/2020  ? GLUCOSE 92 09/27/2020  ? BUN 13 09/27/2020  ? CREATININE 0.62 09/27/2020  ? CALCIUM 9.6 09/27/2020  ? EGFR 110 09/27/2020  ? GFRNONAA 108 09/19/2019  ? ?Lab Results  ?Component Value Date  ? CHOL 226 (H) 09/27/2020  ? HDL 57 09/27/2020  ? LDLCALC 140 (H) 09/27/2020  ? TRIG 165 (H) 09/27/2020  ? CHOLHDL 4.0 09/27/2020  ? ?Lab Results  ?Component Value Date  ? TSH 1.060 09/27/2020  ? ?Lab Results  ?Component Value Date  ? HGBA1C 5.6 03/09/2021  ? ?Lab Results  ?Component Value Date  ? WBC 7.5 09/27/2020  ? HGB 13.9 09/27/2020  ? HCT 42.9 09/27/2020  ? MCV 89 09/27/2020  ? PLT 306 09/27/2020  ? ?Lab Results  ?Component Value Date  ? ALT 12 09/27/2020  ? AST 14 09/27/2020  ? ALKPHOS 37 (L) 09/27/2020  ? BILITOT 0.4 09/27/2020  ? ?Lab Results  ?Component Value Date  ? VD25OH 34.8 10/12/2015  ?  ? ?Review of Systems  ?Constitutional:  Negative for chills, fatigue and fever.  ?Respiratory:  Negative for chest tightness and shortness of breath.   ?Cardiovascular:  Negative for chest pain.  ?Genitourinary:  Positive for frequency, urgency and vaginal discharge (scant itching). Negative  for dysuria.  ?Psychiatric/Behavioral:  Negative for dysphoric mood and sleep disturbance. The patient is not nervous/anxious.   ? ?Patient Active Problem List  ? Diagnosis Date Noted  ? Claustrophobia 03/09/2021  ? Motion sickness 03/09/2021  ? OSA on CPAP 08/04/2020  ? Hx of basal cell carcinoma 03/03/2020  ? Sleep disorder 03/03/2020  ? Perimenopausal symptoms 09/19/2019  ? Irritable bowel syndrome with diarrhea 01/21/2019  ? Migraine without aura and without status migrainosus, not intractable 09/12/2017  ? Arthritis of ankle, right 02/23/2017  ? S/P breast biopsy, left 10/11/2016  ? Hyperlipidemia, mixed 08/24/2016  ? Plantar fasciitis, bilateral 04/14/2016  ? Iron deficiency 07/05/2015  ? Vitamin D deficiency 11/24/2014  ? Prediabetes 11/24/2014  ? Essential (primary) hypertension 08/06/2014  ? Obesity, Class III, BMI 40-49.9 (morbid obesity) ([place]) 08/06/2014  ? ? ?Allergies  ?Allergen Reactions  ? Latex Rash  ? ? ?Past Surgical History:  ?Procedure Laterality Date  ? BREAST BIOPSY Left 10/09/2016  ? Afffirm Biopsy- bx/clip-neg  ? CESAREAN SECTION  03/04/2000  ? CHOLECYSTECTOMY  2006  ? TUBAL LIGATION  2006  ? ? ?Social History  ? ?Tobacco Use  ? Smoking status: Never  ? Smokeless tobacco: Never  ?Vaping Use  ? Vaping Use: Never used  ?Substance Use Topics  ? Alcohol use: No  ?  Alcohol/week: 0.0 standard drinks  ?  Drug use: No  ? ? ? ?Medication list has been reviewed and updated. ? ?Current Meds  ?Medication Sig  ? Cholecalciferol (VITAMIN D) 2000 units CAPS Take 1 capsule (2,000 Units total) by mouth daily.  ? ferrous sulfate 325 (65 FE) MG EC tablet Take 1 tablet (325 mg total) by mouth daily with breakfast.  ? fluconazole (DIFLUCAN) 100 MG tablet Take 1 tablet (100 mg total) by mouth once for 1 dose.  ? fluticasone (FLONASE) 50 MCG/ACT nasal spray Place 1 spray into both nostrils daily.  ? Homeopathic Products (AZO YEAST PLUS PO) Take by mouth.  ? lisinopril-hydrochlorothiazide (ZESTORETIC) 20-25 MG  tablet Take 1 tablet by mouth daily.  ? Loperamide HCl (IMODIUM PO) Take by mouth.  ? sulfamethoxazole-trimethoprim (BACTRIM DS) 800-160 MG tablet Take 1 tablet by mouth 2 (two) times daily for 7 days.  ? triamcinolone ointment (KENALOG) 0.1 % Apply 1 application topically 2 (two) times daily.  ? ? ?PHQ 2/9 Scores 06/28/2021 03/09/2021 09/27/2020 08/04/2020  ?PHQ - 2 Score 0 1 0 0  ?PHQ- 9 Score 0 5 2 0  ? ? ?GAD 7 : Generalized Anxiety Score 06/28/2021 03/09/2021 09/27/2020 08/04/2020  ?Nervous, Anxious, on Edge 1 1 0 1  ?Control/stop worrying 1 2 0 1  ?Worry too much - different things '1 2 1 1  ' ?Trouble relaxing 0 1 0 1  ?Restless 0 0 0 1  ?Easily annoyed or irritable 0 2 0 0  ?Afraid - awful might happen 0 0 0 0  ?Total GAD 7 Score '3 8 1 5  ' ?Anxiety Difficulty Somewhat difficult Somewhat difficult Not difficult at all Not difficult at all  ? ? ?BP Readings from Last 3 Encounters:  ?06/28/21 132/78  ?03/09/21 118/70  ?02/27/21 104/71  ? ? ?Physical Exam ?Vitals and nursing note reviewed.  ?Constitutional:   ?   General: She is not in acute distress. ?   Appearance: Normal appearance. She is well-developed.  ?HENT:  ?   Head: Normocephalic and atraumatic.  ?Cardiovascular:  ?   Rate and Rhythm: Normal rate and regular rhythm.  ?   Pulses: Normal pulses.  ?Pulmonary:  ?   Effort: Pulmonary effort is normal. No respiratory distress.  ?   Breath sounds: No wheezing or rhonchi.  ?Abdominal:  ?   Palpations: Abdomen is soft.  ?   Tenderness: There is no abdominal tenderness. There is no right CVA tenderness or left CVA tenderness.  ?Musculoskeletal:  ?   Cervical back: Normal range of motion.  ?Skin: ?   General: Skin is warm and dry.  ?   Findings: No rash.  ?Neurological:  ?   Mental Status: She is alert and oriented to person, place, and time.  ?Psychiatric:     ?   Mood and Affect: Mood normal.     ?   Behavior: Behavior normal.  ? ? ?Wt Readings from Last 3 Encounters:  ?06/28/21 216 lb (98 kg)  ?03/09/21 235 lb (106.6 kg)   ?02/27/21 231 lb (104.8 kg)  ? ? ?BP 132/78   Pulse 78   Ht '4\' 11"'  (1.499 m)   Wt 216 lb (98 kg)   LMP 06/18/2021 (Exact Date)   SpO2 98%   BMI 43.63 kg/m?  ? ?Assessment and Plan: ?1. Acute vaginitis ?Wet prep unremarkable - could be partially treated yeast so will give diflucan x1 ?- POCT Wet Prep with KOH ?- fluconazole (DIFLUCAN) 100 MG tablet; Take 1 tablet (100 mg total) by mouth  once for 1 dose.  Dispense: 1 tablet; Refill: 0 ? ?2. Dysuria ?UA positive with minimal sx though these could be causing some of the irritation. ?Budding yeast were seen on UA micro ?- POC urinalysis w microscopic (non auto) ?- sulfamethoxazole-trimethoprim (BACTRIM DS) 800-160 MG tablet; Take 1 tablet by mouth 2 (two) times daily for 7 days.  Dispense: 14 tablet; Refill: 0 ? ?3. Obesity, Class III, BMI 40-49.9 (morbid obesity) ([place]) ?Excellent weight loss with diet and exercise. ? ? ?Partially dictated using Editor, commissioning. Any errors are unintentional. ? ?Halina Maidens, MD ?[facility] ?[facility] ? ?06/28/2021 ? ? ? ? ? ?

## 2021-06-28 NOTE — Telephone Encounter (Signed)
Please call pt for an appointment. She needs to be seen before any medication can be sent in. ? ?KP

## 2021-06-28 NOTE — Telephone Encounter (Signed)
Pt stated a possible yeast infection itching and burning when wiping started last week on Thursday. Pt stated she has been using AZO yeast, but it is not working for her.   ?Pt declined to make an appointment, requesting medication.  ? ?Seeking clinical advice.   ?  ? ?Chief Complaint: Vaginal itching ?Symptoms: Itching, small amount of whitish discharge ?Frequency: This weekend onset ?Pertinent Negatives: Patient denies rash, redness, odor. Has not been on antibiotics ?Disposition: [] ED /[] Urgent Care (no appt availability in office) / [] Appointment(In office/virtual)/ []  [place] [facility]/ [] Home Care/ [] Refused Recommended Disposition /[] [place] [facility]/ [x]  Follow-up with PCP ?Additional Notes: Pt reports history of yeast infections, has had prescribed med in pats. Reports has used OTC meds, cream, ineffective. Declines appt, requesting med called in. ?Reason for Disposition ? [1] Symptoms of a "yeast infection" (i.e., itchy, white discharge, not bad smelling) AND [2] not improved > 3 days following CARE ADVICE ? ?Answer Assessment - Initial Assessment Questions ?1. DISCHARGE: "Describe the discharge." (e.g., white, yellow, green, gray, foamy, cottage cheese-like) ?    Whitish, small amount ?2. ODOR: "Is there a bad odor?" ?    No ?3. ONSET: "When did the discharge begin?" ?    This weekend ?4. RASH: "Is there a rash in that area?" If Yes, ask: "Describe it." (e.g., redness, blisters, sores, bumps) ?    Unsure ?5. ABDOMINAL PAIN: "Are you having any abdominal pain?" If Yes, ask: "What does it feel like? " (e.g., crampy, dull, intermittent, constant)  ?    no ?6. ABDOMINAL PAIN SEVERITY: If present, ask: "How bad is it?"  (e.g., mild, moderate, severe) ? - MILD - doesn't interfere with normal activities  ? - MODERATE - interferes with normal activities or awakens from sleep  ? - SEVERE - patient doesn't want to move (R/O peritonitis)  ?    NA ?7. CAUSE: "What do you think is causing the discharge?"  "Have you had the same problem before? What happened then?" ?    Yeast ?8. OTHER SYMPTOMS: "Do you have any other symptoms?" (e.g., fever, itching, vaginal bleeding, pain with urination, injury to genital area, vaginal foreign body) ?    Itching ? ?Protocols used: Vaginal Discharge-A-AH ? ?

## 2021-06-28 NOTE — Progress Notes (Signed)
? ? ?Date:  06/28/2021  ? ?Name:  Nicole Vargas   DOB:  10-Apr-1971   MRN:  [id number] ? ? ?Chief Complaint: Vaginal Itching ? ?Vaginal Itching ?The patient's primary symptoms include genital itching and vaginal discharge. The patient's pertinent negatives include no genital odor or vaginal bleeding. This is a new problem. The current episode started in the past 7 days. The problem has been waxing and waning. The problem affects both sides. She is not pregnant. The vaginal discharge was normal.  ? ?Lab Results  ?Component Value Date  ? NA 139 09/27/2020  ? K 4.5 09/27/2020  ? CO2 19 (L) 09/27/2020  ? GLUCOSE 92 09/27/2020  ? BUN 13 09/27/2020  ? CREATININE 0.62 09/27/2020  ? CALCIUM 9.6 09/27/2020  ? EGFR 110 09/27/2020  ? GFRNONAA 108 09/19/2019  ? ?Lab Results  ?Component Value Date  ? CHOL 226 (H) 09/27/2020  ? HDL 57 09/27/2020  ? LDLCALC 140 (H) 09/27/2020  ? TRIG 165 (H) 09/27/2020  ? CHOLHDL 4.0 09/27/2020  ? ?Lab Results  ?Component Value Date  ? TSH 1.060 09/27/2020  ? ?Lab Results  ?Component Value Date  ? HGBA1C 5.6 03/09/2021  ? ?Lab Results  ?Component Value Date  ? WBC 7.5 09/27/2020  ? HGB 13.9 09/27/2020  ? HCT 42.9 09/27/2020  ? MCV 89 09/27/2020  ? PLT 306 09/27/2020  ? ?Lab Results  ?Component Value Date  ? ALT 12 09/27/2020  ? AST 14 09/27/2020  ? ALKPHOS 37 (L) 09/27/2020  ? BILITOT 0.4 09/27/2020  ? ?Lab Results  ?Component Value Date  ? VD25OH 34.8 10/12/2015  ?  ? ?Review of Systems  ?Genitourinary:  Positive for vaginal discharge.  ? ?Patient Active Problem List  ? Diagnosis Date Noted  ? Claustrophobia 03/09/2021  ? Motion sickness 03/09/2021  ? OSA on CPAP 08/04/2020  ? Hx of basal cell carcinoma 03/03/2020  ? Sleep disorder 03/03/2020  ? Perimenopausal symptoms 09/19/2019  ? Irritable bowel syndrome with diarrhea 01/21/2019  ? Migraine without aura and without status migrainosus, not intractable 09/12/2017  ? Arthritis of ankle, right 02/23/2017  ? S/P breast biopsy, left  10/11/2016  ? Hyperlipidemia, mixed 08/24/2016  ? Plantar fasciitis, bilateral 04/14/2016  ? Iron deficiency 07/05/2015  ? Vitamin D deficiency 11/24/2014  ? Prediabetes 11/24/2014  ? Essential (primary) hypertension 08/06/2014  ? Obesity, Class III, BMI 40-49.9 (morbid obesity) ([place]) 08/06/2014  ? ? ?Allergies  ?Allergen Reactions  ? Latex Rash  ? ? ?Past Surgical History:  ?Procedure Laterality Date  ? BREAST BIOPSY Left 10/09/2016  ? Afffirm Biopsy- bx/clip-neg  ? CESAREAN SECTION  03/04/2000  ? CHOLECYSTECTOMY  2006  ? TUBAL LIGATION  2006  ? ? ?Social History  ? ?Tobacco Use  ? Smoking status: Never  ? Smokeless tobacco: Never  ?Vaping Use  ? Vaping Use: Never used  ?Substance Use Topics  ? Alcohol use: No  ?  Alcohol/week: 0.0 standard drinks  ? Drug use: No  ? ? ? ?Medication list has been reviewed and updated. ? ?No outpatient medications have been marked as taking for the 06/28/21 encounter (Office Visit) with Glean Hess, MD.  ? ? ?PHQ 2/9 Scores 03/09/2021 09/27/2020 08/04/2020 03/03/2020  ?PHQ - 2 Score 1 0 0 0  ?PHQ- 9 Score 5 2 0 0  ? ? ?GAD 7 : Generalized Anxiety Score 03/09/2021 09/27/2020 08/04/2020 08/04/2020  ?Nervous, Anxious, on Edge 1 0 1 0  ?Control/stop worrying 2 0 1 0  ?  Worry too much - different things _0 0  ?Trouble relaxing 1 0 1 0  ?Restless 0 0 1 0  ?Easily annoyed or irritable 2 0 0 0  ?Afraid - awful might happen 0 0 0 0  ?Total GAD 7 Score _1 0  ?Anxiety Difficulty Somewhat difficult Not difficult at all Not difficult at all Not difficult at all  ? ? ?BP Readings from Last 3 Encounters:  ?03/09/21 118/70  ?02/27/21 104/71  ?11/07/20 114/76  ? ? ?Physical Exam ? ?Wt Readings from Last 3 Encounters:  ?03/09/21 235 lb (106.6 kg)  ?02/27/21 231 lb (104.8 kg)  ?11/07/20 230 lb (104.3 kg)  ? ? ?Ht _2  (1.499 m)   BMI 47.46 kg/m?  ? ?Assessment and Plan: ? ? ? ? ?

## 2021-07-01 ENCOUNTER — Ambulatory Visit (INDEPENDENT_AMBULATORY_CARE_PROVIDER_SITE_OTHER): Payer: Commercial Managed Care - PPO | Admitting: Internal Medicine

## 2021-07-01 ENCOUNTER — Ambulatory Visit: Payer: Self-pay

## 2021-07-01 ENCOUNTER — Other Ambulatory Visit: Payer: Self-pay

## 2021-07-01 ENCOUNTER — Encounter: Payer: Self-pay | Admitting: Internal Medicine

## 2021-07-01 VITALS — BP 92/62 | HR 82 | Ht 59.0 in | Wt 217.0 lb

## 2021-07-01 DIAGNOSIS — L270 Generalized skin eruption due to drugs and medicaments taken internally: Secondary | ICD-10-CM

## 2021-07-01 DIAGNOSIS — N760 Acute vaginitis: Secondary | ICD-10-CM

## 2021-07-01 MED ORDER — METRONIDAZOLE 500 MG PO TABS: 500.0000 mg | ORAL_TABLET | Freq: Two times a day (BID) | ORAL | 0 refills | Status: AC

## 2021-07-01 NOTE — Progress Notes (Signed)
? ? ?Date:  07/01/2021  ? ?Name:  Nicole Vargas   DOB:  03/14/1971   MRN:  [id number] ? ? ?Chief Complaint: Rash (On both arms, not itching today ) ? ?Rash ?This is a new problem. The current episode started in the past 7 days. The problem has been gradually worsening since onset. The rash is diffuse. The rash is characterized by redness and itchiness. She was exposed to a new medication (started Bactrim 4 days ago for uti). Pertinent negatives include no cough, fatigue, fever or shortness of breath.  ?Vaginal Pain ?The patient's pertinent negatives include no genital lesions, genital rash or vaginal bleeding. This is a new problem. The current episode started in the past 7 days. The problem has been unchanged. The pain is mild. Associated symptoms include rash. Pertinent negatives include no chills, fever or headaches.  ? ?Lab Results  ?Component Value Date  ? NA 139 09/27/2020  ? K 4.5 09/27/2020  ? CO2 19 (L) 09/27/2020  ? GLUCOSE 92 09/27/2020  ? BUN 13 09/27/2020  ? CREATININE 0.62 09/27/2020  ? CALCIUM 9.6 09/27/2020  ? EGFR 110 09/27/2020  ? GFRNONAA 108 09/19/2019  ? ?Lab Results  ?Component Value Date  ? CHOL 226 (H) 09/27/2020  ? HDL 57 09/27/2020  ? LDLCALC 140 (H) 09/27/2020  ? TRIG 165 (H) 09/27/2020  ? CHOLHDL 4.0 09/27/2020  ? ?Lab Results  ?Component Value Date  ? TSH 1.060 09/27/2020  ? ?Lab Results  ?Component Value Date  ? HGBA1C 5.6 03/09/2021  ? ?Lab Results  ?Component Value Date  ? WBC 7.5 09/27/2020  ? HGB 13.9 09/27/2020  ? HCT 42.9 09/27/2020  ? MCV 89 09/27/2020  ? PLT 306 09/27/2020  ? ?Lab Results  ?Component Value Date  ? ALT 12 09/27/2020  ? AST 14 09/27/2020  ? ALKPHOS 37 (L) 09/27/2020  ? BILITOT 0.4 09/27/2020  ? ?Lab Results  ?Component Value Date  ? VD25OH 34.8 10/12/2015  ?  ? ?Review of Systems  ?Constitutional:  Negative for chills, fatigue and fever.  ?Respiratory:  Negative for cough, chest tightness and shortness of breath.   ?Cardiovascular:  Negative for chest  pain and palpitations.  ?Genitourinary:  Positive for dyspareunia and vaginal pain. Negative for genital sores.  ?Skin:  Positive for rash.  ?Neurological:  Negative for dizziness, light-headedness and headaches.  ? ?Patient Active Problem List  ? Diagnosis Date Noted  ? Claustrophobia 03/09/2021  ? Motion sickness 03/09/2021  ? OSA on CPAP 08/04/2020  ? Hx of basal cell carcinoma 03/03/2020  ? Sleep disorder 03/03/2020  ? Perimenopausal symptoms 09/19/2019  ? Irritable bowel syndrome with diarrhea 01/21/2019  ? Migraine without aura and without status migrainosus, not intractable 09/12/2017  ? Arthritis of ankle, right 02/23/2017  ? S/P breast biopsy, left 10/11/2016  ? Hyperlipidemia, mixed 08/24/2016  ? Plantar fasciitis, bilateral 04/14/2016  ? Iron deficiency 07/05/2015  ? Vitamin D deficiency 11/24/2014  ? Prediabetes 11/24/2014  ? Essential (primary) hypertension 08/06/2014  ? Obesity, Class III, BMI 40-49.9 (morbid obesity) ([place]) 08/06/2014  ? ? ?Allergies  ?Allergen Reactions  ? Bactrim [Sulfamethoxazole-Trimethoprim] Rash  ? Latex Rash  ? ? ?Past Surgical History:  ?Procedure Laterality Date  ? BREAST BIOPSY Left 10/09/2016  ? Afffirm Biopsy- bx/clip-neg  ? CESAREAN SECTION  03/04/2000  ? CHOLECYSTECTOMY  2006  ? TUBAL LIGATION  2006  ? ? ?Social History  ? ?Tobacco Use  ? Smoking status: Never  ? Smokeless tobacco: Never  ?Vaping  Use  ? Vaping Use: Never used  ?Substance Use Topics  ? Alcohol use: No  ?  Alcohol/week: 0.0 standard drinks  ? Drug use: No  ? ? ? ?Medication list has been reviewed and updated. ? ?Current Meds  ?Medication Sig  ? Cholecalciferol (VITAMIN D) 2000 units CAPS Take 1 capsule (2,000 Units total) by mouth daily.  ? ferrous sulfate 325 (65 FE) MG EC tablet Take 1 tablet (325 mg total) by mouth daily with breakfast.  ? fluticasone (FLONASE) 50 MCG/ACT nasal spray Place 1 spray into both nostrils daily. (Patient taking differently: Place 1 spray into both nostrils as needed.)  ?  lisinopril-hydrochlorothiazide (ZESTORETIC) 20-25 MG tablet Take 1 tablet by mouth daily. (Patient taking differently: Take 1 tablet by mouth daily. Half tablet)  ? Loperamide HCl (IMODIUM PO) Take by mouth as needed.  ? metroNIDAZOLE (FLAGYL) 500 MG tablet Take 1 tablet (500 mg total) by mouth 2 (two) times daily for 7 days.  ? [DISCONTINUED] Homeopathic Products (AZO YEAST PLUS PO) Take by mouth.  ? [DISCONTINUED] triamcinolone ointment (KENALOG) 0.1 % Apply 1 application topically 2 (two) times daily.  ? ? ?PHQ 2/9 Scores 07/01/2021 06/28/2021 03/09/2021 09/27/2020  ?PHQ - 2 Score 1 0 1 0  ?PHQ- 9 Score 2 0 5 2  ? ? ?GAD 7 : Generalized Anxiety Score 07/01/2021 06/28/2021 03/09/2021 09/27/2020  ?Nervous, Anxious, on Edge _0 0  ?Control/stop worrying 0 1 2 0  ?Worry too much - different things 0 _1 ?Trouble relaxing 0 0 1 0  ?Restless 0 0 0 0  ?Easily annoyed or irritable 1 0 2 0  ?Afraid - awful might happen 0 0 0 0  ?Total GAD 7 Score _2 ?Anxiety Difficulty - Somewhat difficult Somewhat difficult Not difficult at all  ? ? ?BP Readings from Last 3 Encounters:  ?07/01/21 92/62  ?06/28/21 132/78  ?03/09/21 118/70  ? ? ?Physical Exam ?Vitals and nursing note reviewed.  ?Constitutional:   ?   General: She is not in acute distress. ?   Appearance: She is well-developed.  ?HENT:  ?   Head: Normocephalic and atraumatic.  ?Cardiovascular:  ?   Rate and Rhythm: Normal rate and regular rhythm.  ?Pulmonary:  ?   Effort: Pulmonary effort is normal. No respiratory distress.  ?Musculoskeletal:  ?   Cervical back: Normal range of motion.  ?Lymphadenopathy:  ?   Cervical: No cervical adenopathy.  ?Skin: ?   General: Skin is warm and dry.  ?   Findings: Rash (fine papular rash on both arms) present.  ?Neurological:  ?   Mental Status: She is alert and oriented to person, place, and time.  ?Psychiatric:     ?   Mood and Affect: Mood normal.     ?   Behavior: Behavior normal.  ? ? ?Wt Readings from Last 3 Encounters:  ?07/01/21  217 lb (98.4 kg)  ?06/28/21 216 lb (98 kg)  ?03/09/21 235 lb (106.6 kg)  ? ? ?BP 92/62   Pulse 82   Ht _3  (1.499 m)   Wt 217 lb (98.4 kg)   LMP 06/18/2021 (Exact Date)   SpO2 98%   BMI 43.83 kg/m?  ? ?Assessment and Plan: ?1. Allergic drug rash due to sulfonamide ?Partially treated questionable UTI sx. ?Remain off of medications for UTI at this time. ? ?2. Vaginosis ?Some pelvic sx improved with treatment for yeast but still painful intercourse and with hygiene  after urination. ?Will treat for BV ?- metroNIDAZOLE (FLAGYL) 500 MG tablet; Take 1 tablet (500 mg total) by mouth 2 (two) times daily for 7 days.  Dispense: 14 tablet; Refill: 0 ? ? ?Partially dictated using Editor, commissioning. Any errors are unintentional. ? ?Halina Maidens, MD ?[facility] ?[facility] ? ?07/01/2021 ? ? ? ? ? ?

## 2021-07-01 NOTE — Telephone Encounter (Signed)
?  Chief Complaint: Itching/rash - Allergic reaction ?Symptoms: Red itchy arms ?Frequency: since yesterday afternoon ?Pertinent Negatives: Patient denies difficulty breathing. Tongue swelling ?Disposition: [] ED /[] Urgent Care (no appt availability in office) / [x] Appointment(In office/virtual)/ []  [place] Virtual [facility]/ [] Home Care/ [] Refused Recommended Disposition /[]  Mobile Bus/ []  Follow-up with PCP ?Additional Notes: Pt started with itchy arms/rash yesterday afternoon. Pt d/c'd medication , called pharmacy and started benadryl. Pt still has some red spots - however reaction as mostly subsided.  ? ?Reason for Disposition ? Taking new prescription antibiotic (Exception: finished taking new prescription antibiotic) ? ?Answer Assessment - Initial Assessment Questions ?1. APPEARANCE of RASH: "Describe the rash." (e.g., spots, blisters, raised areas, skin peeling, scaly) ?    Itchy  - more on right arm ?2. SIZE: "How big are the spots?" (e.g., tip of pen, eraser, coin; inches, centimeters) ?    Pencil eraser. ?3. LOCATION: "Where is the rash located?" ?    Arms ?4. COLOR: "What color is the rash?" (Note: It is difficult to assess rash color in people with darker-colored skin. When this situation occurs, simply ask the caller to describe what they see.) ?    red ?5. ONSET: "When did the rash begin?" ?    Yesterday afternoon ?6. FEVER: "Do you have a fever?" If Yes, ask: "What is your temperature, how was it measured, and when did it start?" ?    na ?7. ITCHING: "Does the rash itch?" If Yes, ask: "How bad is the itch?" (Scale 1-10; or mild, moderate, severe) ?    6 ?8. CAUSE: "What do you think is causing the rash?" ?    Medication reaction ?9. NEW MEDICATION: "What new medication are you taking?" (e.g., name of antibiotic) "When did you start taking this medication?". ?    *No Answer* ?10. OTHER SYMPTOMS: "Do you have any other symptoms?" (e.g., sore throat, fever, joint pain) ?      no ?11. PREGNANCY:  "Is there any chance you are pregnant?" "When was your last menstrual period?" ?      no ? ?Protocols used: Rash - Widespread On Drugs-A-AH ? ?

## 2021-07-12 ENCOUNTER — Other Ambulatory Visit (HOSPITAL_COMMUNITY)
Admission: RE | Admit: 2021-07-12 | Discharge: 2021-07-12 | Disposition: A | Discharge status: Home / Self Care | Payer: Commercial Managed Care - PPO | Source: Ambulatory Visit | Attending: Internal Medicine | Admitting: Internal Medicine

## 2021-07-12 ENCOUNTER — Ambulatory Visit (INDEPENDENT_AMBULATORY_CARE_PROVIDER_SITE_OTHER): Payer: Commercial Managed Care - PPO | Admitting: Internal Medicine

## 2021-07-12 ENCOUNTER — Encounter: Payer: Self-pay | Admitting: Internal Medicine

## 2021-07-12 ENCOUNTER — Other Ambulatory Visit: Payer: Self-pay

## 2021-07-12 VITALS — BP 118/66 | HR 85 | Ht 59.0 in | Wt 215.0 lb

## 2021-07-12 DIAGNOSIS — Z124 Encounter for screening for malignant neoplasm of cervix: Secondary | ICD-10-CM | POA: Diagnosis not present

## 2021-07-12 DIAGNOSIS — N761 Subacute and chronic vaginitis: Secondary | ICD-10-CM | POA: Diagnosis not present

## 2021-07-12 DIAGNOSIS — N3000 Acute cystitis without hematuria: Secondary | ICD-10-CM

## 2021-07-12 LAB — POC URINALYSIS WITH MICROSCOPIC (NON AUTO)MANUAL RESULT
Bilirubin, UA: NEGATIVE
Blood, UA: NEGATIVE
Crystals: 0
Glucose, UA: NEGATIVE
Ketones, UA: NEGATIVE
Mucus, UA: 0
Nitrite, UA: NEGATIVE
Protein, UA: NEGATIVE
RBC: 3 M/uL — AB (ref 4.04–5.48)
Spec Grav, UA: 1.015 (ref 1.010–1.025)
Urobilinogen, UA: 0.2 E.U./dL
WBC Casts, UA: 10
pH, UA: 6 (ref 5.0–8.0)

## 2021-07-12 LAB — POCT WET PREP WITH KOH
KOH Prep POC: NEGATIVE
RBC Wet Prep HPF POC: 3
Trichomonas, UA: NEGATIVE
Yeast Wet Prep HPF POC: NEGATIVE

## 2021-07-12 MED ORDER — CLINDAMYCIN HCL 300 MG PO CAPS: 300.0000 mg | ORAL_CAPSULE | Freq: Two times a day (BID) | ORAL | 0 refills | Status: AC

## 2021-07-12 NOTE — Progress Notes (Signed)
? ? ?Date:  07/12/2021  ? ?Name:  Nicole Vargas   DOB:  24-Jun-1970   MRN:  [id number] ? ? ?Chief Complaint: Urinary Tract Infection (Burning and pain while urinating. Itching.) ? ?Urinary Tract Infection  ?This is a recurrent problem. The current episode started 1 to 4 weeks ago. The problem occurs every urination. The problem has been unchanged. The quality of the pain is described as aching and burning. The pain is moderate. There has been no fever. She is Sexually active. There is No history of pyelonephritis. Associated symptoms include frequency and urgency.  ?Vaginal Discharge ?The patient's primary symptoms include genital itching and vaginal discharge. The patient's pertinent negatives include no genital rash. This is a recurrent problem. The problem has been unchanged. The patient is experiencing no pain. The problem affects both sides. She is not pregnant. Associated symptoms include frequency and urgency. Pertinent negatives include no fever. The vaginal discharge was white and thick. Treatments tried: flagyl and diflucan. She is sexually active. No, her partner does not have an STD.  ? ?Lab Results  ?Component Value Date  ? NA 139 09/27/2020  ? K 4.5 09/27/2020  ? CO2 19 (L) 09/27/2020  ? GLUCOSE 92 09/27/2020  ? BUN 13 09/27/2020  ? CREATININE 0.62 09/27/2020  ? CALCIUM 9.6 09/27/2020  ? EGFR 110 09/27/2020  ? GFRNONAA 108 09/19/2019  ? ?Lab Results  ?Component Value Date  ? CHOL 226 (H) 09/27/2020  ? HDL 57 09/27/2020  ? LDLCALC 140 (H) 09/27/2020  ? TRIG 165 (H) 09/27/2020  ? CHOLHDL 4.0 09/27/2020  ? ?Lab Results  ?Component Value Date  ? TSH 1.060 09/27/2020  ? ?Lab Results  ?Component Value Date  ? HGBA1C 5.6 03/09/2021  ? ?Lab Results  ?Component Value Date  ? WBC 7.5 09/27/2020  ? HGB 13.9 09/27/2020  ? HCT 42.9 09/27/2020  ? MCV 89 09/27/2020  ? PLT 306 09/27/2020  ? ?Lab Results  ?Component Value Date  ? ALT 12 09/27/2020  ? AST 14 09/27/2020  ? ALKPHOS 37 (L) 09/27/2020  ? BILITOT 0.4  09/27/2020  ? ?Lab Results  ?Component Value Date  ? VD25OH 34.8 10/12/2015  ?  ? ?Review of Systems  ?Constitutional:  Negative for fatigue and fever.  ?Genitourinary:  Positive for frequency, urgency and vaginal discharge.  ? ?Patient Active Problem List  ? Diagnosis Date Noted  ? Claustrophobia 03/09/2021  ? Motion sickness 03/09/2021  ? OSA on CPAP 08/04/2020  ? Hx of basal cell carcinoma 03/03/2020  ? Sleep disorder 03/03/2020  ? Perimenopausal symptoms 09/19/2019  ? Irritable bowel syndrome with diarrhea 01/21/2019  ? Migraine without aura and without status migrainosus, not intractable 09/12/2017  ? Arthritis of ankle, right 02/23/2017  ? S/P breast biopsy, left 10/11/2016  ? Hyperlipidemia, mixed 08/24/2016  ? Plantar fasciitis, bilateral 04/14/2016  ? Iron deficiency 07/05/2015  ? Vitamin D deficiency 11/24/2014  ? Prediabetes 11/24/2014  ? Essential (primary) hypertension 08/06/2014  ? Obesity, Class III, BMI 40-49.9 (morbid obesity) ([place]) 08/06/2014  ? ? ?Allergies  ?Allergen Reactions  ? Bactrim [Sulfamethoxazole-Trimethoprim] Rash  ? Latex Rash  ? ? ?Past Surgical History:  ?Procedure Laterality Date  ? BREAST BIOPSY Left 10/09/2016  ? [facility] Biopsy- bx/clip-neg  ? CESAREAN SECTION  03/04/2000  ? CHOLECYSTECTOMY  2006  ? TUBAL LIGATION  2006  ? ? ?Social History  ? ?Tobacco Use  ? Smoking status: Never  ? Smokeless tobacco: Never  ?Vaping Use  ? Vaping Use: Never  used  ?Substance Use Topics  ? Alcohol use: No  ?  Alcohol/week: 0.0 standard drinks  ? Drug use: No  ? ? ? ?Medication list has been reviewed and updated. ? ?Current Meds  ?Medication Sig  ? clindamycin (CLEOCIN) 300 MG capsule Take 1 capsule (300 mg total) by mouth in the morning and at bedtime for 5 days.  ? ? ?PHQ 2/9 Scores 07/12/2021 07/01/2021 06/28/2021 03/09/2021  ?PHQ - 2 Score 2 1 0 1  ?PHQ- 9 Score 4 2 0 5  ? ? ?GAD 7 : Generalized Anxiety Score 07/12/2021 07/01/2021 06/28/2021 03/09/2021  ?Nervous, Anxious, on Edge '1 1 1 1  ' ?Control/stop  worrying 1 0 1 2  ?Worry too much - different things 1 0 1 2  ?Trouble relaxing 0 0 0 1  ?Restless 1 0 0 0  ?Easily annoyed or irritable 1 1 0 2  ?Afraid - awful might happen 0 0 0 0  ?Total GAD 7 Score '5 2 3 8  ' ?Anxiety Difficulty Not difficult at all - Somewhat difficult Somewhat difficult  ? ? ?BP Readings from Last 3 Encounters:  ?07/12/21 118/66  ?07/01/21 92/62  ?06/28/21 132/78  ? ? ?Physical Exam ?Vitals and nursing note reviewed.  ?Constitutional:   ?   General: She is not in acute distress. ?   Appearance: She is well-developed.  ?HENT:  ?   Head: Normocephalic and atraumatic.  ?Cardiovascular:  ?   Rate and Rhythm: Normal rate and regular rhythm.  ?Pulmonary:  ?   Effort: Pulmonary effort is normal. No respiratory distress.  ?   Breath sounds: No wheezing or rhonchi.  ?Genitourinary: ?   Labia:     ?   Right: No tenderness, lesion or injury.     ?   Left: No tenderness, lesion or injury.   ?   Vagina: Normal.  ?   Cervix: No cervical motion tenderness, friability or erythema.  ?   Uterus: Normal.   ?   Adnexa: Right adnexa normal and left adnexa normal.  ?   Comments: Pap obtained ?Wet prep swab obtained ?Skin: ?   General: Skin is warm and dry.  ?   Findings: No rash.  ?Neurological:  ?   Mental Status: She is alert and oriented to person, place, and time.  ?Psychiatric:     ?   Mood and Affect: Mood normal.     ?   Behavior: Behavior normal.  ? ? ?Wt Readings from Last 3 Encounters:  ?07/12/21 215 lb (97.5 kg)  ?07/01/21 217 lb (98.4 kg)  ?06/28/21 216 lb (98 kg)  ? ? ?BP 118/66   Pulse 85   Ht '4\' 11"'  (1.499 m)   Wt 215 lb (97.5 kg)   LMP 06/18/2021 (Exact Date)   SpO2 95%   BMI 43.42 kg/m?  ? ?Assessment and Plan: ?1. Subacute vaginitis ?Wet prep with clue cells and bacteria; no trich or yeast ?Did not respond to Flagyl. ?- POCT Wet Prep with KOH ?- clindamycin (CLEOCIN) 300 MG capsule; Take 1 capsule (300 mg total) by mouth in the morning and at bedtime for 5 days.  Dispense: 10 capsule; Refill:  0 ? ?2. Acute cystitis without hematuria ?UA positive - may be vaginal contamination but will send for culture to be certain ?- POC urinalysis w microscopic (non auto) ?- Urine Culture ? ?3. Encounter for screening for cervical cancer ?Obtained today ?- Cytology - PAP ? ? ?Partially dictated using Editor, commissioning. Any errors  are unintentional. ? ?Halina Maidens, MD ?[facility] ? ?07/12/2021 ? ? ? ? ? ?

## 2021-07-14 LAB — CYTOLOGY - PAP
Comment: NEGATIVE
Diagnosis: NEGATIVE
High risk HPV: NEGATIVE

## 2021-07-15 ENCOUNTER — Other Ambulatory Visit: Payer: Self-pay | Admitting: Internal Medicine

## 2021-07-15 DIAGNOSIS — N3000 Acute cystitis without hematuria: Secondary | ICD-10-CM

## 2021-07-15 LAB — URINE CULTURE

## 2021-07-15 MED ORDER — AMPICILLIN 500 MG PO CAPS: 500.0000 mg | ORAL_CAPSULE | Freq: Four times a day (QID) | ORAL | 0 refills | Status: AC

## 2021-09-29 ENCOUNTER — Encounter: Payer: Self-pay | Admitting: Internal Medicine

## 2021-09-29 ENCOUNTER — Ambulatory Visit (INDEPENDENT_AMBULATORY_CARE_PROVIDER_SITE_OTHER): Payer: Commercial Managed Care - PPO | Admitting: Internal Medicine

## 2021-09-29 VITALS — BP 128/78 | HR 78 | Ht 59.0 in | Wt 192.0 lb

## 2021-09-29 DIAGNOSIS — R7303 Prediabetes: Secondary | ICD-10-CM | POA: Diagnosis not present

## 2021-09-29 DIAGNOSIS — I10 Essential (primary) hypertension: Secondary | ICD-10-CM | POA: Diagnosis not present

## 2021-09-29 DIAGNOSIS — Z0000 Encounter for general adult medical examination without abnormal findings: Secondary | ICD-10-CM

## 2021-09-29 DIAGNOSIS — Z1211 Encounter for screening for malignant neoplasm of colon: Secondary | ICD-10-CM

## 2021-09-29 DIAGNOSIS — E782 Mixed hyperlipidemia: Secondary | ICD-10-CM

## 2021-09-29 DIAGNOSIS — Z1231 Encounter for screening mammogram for malignant neoplasm of breast: Secondary | ICD-10-CM

## 2021-09-29 LAB — POCT URINALYSIS DIPSTICK
Bilirubin, UA: NEGATIVE
Blood, UA: NEGATIVE
Glucose, UA: NEGATIVE
Ketones, UA: NEGATIVE
Leukocytes, UA: NEGATIVE
Nitrite, UA: NEGATIVE
Protein, UA: NEGATIVE
Spec Grav, UA: 1.01 (ref 1.010–1.025)
Urobilinogen, UA: 0.2 E.U./dL
pH, UA: 6 (ref 5.0–8.0)

## 2021-09-29 MED ORDER — LISINOPRIL-HYDROCHLOROTHIAZIDE 10-12.5 MG PO TABS: 1.0000 | ORAL_TABLET | Freq: Every day | ORAL | 3 refills | Status: DC

## 2021-09-29 NOTE — Progress Notes (Signed)
Date:  09/29/2021   Name:  Nicole Vargas   DOB:  [birth year]   MRN:  [id number]   Chief Complaint: Annual Exam (Breast exam no pap ) Bricia Jalana Moore is a 51 y.o. female who presents today for her Complete Annual Exam. She feels well. She reports exercising gym 4 days a week and bike riding . She reports she is sleeping fairly well. Breast complaints none.  She has lost over 40 lbs now with diet changes and exercise.  Mammogram: 04/2020 DEXA: none Pap smear: 06/2021 neg with co-testing Colonoscopy: none  Health Maintenance Due  Topic Date Due   TETANUS/TDAP  Never done   COLONOSCOPY (Pts 45-77yr Insurance coverage will need to be confirmed)  Never done    Immunization History  Administered Date(s) Administered   Influenza,inj,Quad PF,6+ Mos 02/23/2017   PFIZER(Purple Top)SARS-COV-2 Vaccination 07/25/2019, 08/19/2019    Hypertension This is a chronic problem. The problem is controlled. Pertinent negatives include no chest pain, headaches, palpitations or shortness of breath. Past treatments include ACE inhibitors and diuretics. The current treatment provides significant improvement. There are no compliance problems.     Lab Results  Component Value Date   NA 139 09/27/2020   K 4.5 09/27/2020   CO2 19 (L) 09/27/2020   GLUCOSE 92 09/27/2020   BUN 13 09/27/2020   CREATININE 0.62 09/27/2020   CALCIUM 9.6 09/27/2020   EGFR 110 09/27/2020   GFRNONAA 108 09/19/2019   Lab Results  Component Value Date   CHOL 226 (H) 09/27/2020   HDL 57 09/27/2020   LDLCALC 140 (H) 09/27/2020   TRIG 165 (H) 09/27/2020   CHOLHDL 4.0 09/27/2020   Lab Results  Component Value Date   TSH 1.060 09/27/2020   Lab Results  Component Value Date   HGBA1C 5.6 03/09/2021   Lab Results  Component Value Date   WBC 7.5 09/27/2020   HGB 13.9 09/27/2020   HCT 42.9 09/27/2020   MCV 89 09/27/2020   PLT 306 09/27/2020   Lab Results  Component Value Date   ALT 12 09/27/2020    AST 14 09/27/2020   ALKPHOS 37 (L) 09/27/2020   BILITOT 0.4 09/27/2020   Lab Results  Component Value Date   VD25OH 34.8 10/12/2015     Review of Systems  Constitutional:  Negative for chills, fatigue and fever.  HENT:  Negative for congestion, hearing loss, tinnitus, trouble swallowing and voice change.   Eyes:  Negative for visual disturbance.  Respiratory:  Negative for cough, chest tightness, shortness of breath and wheezing.   Cardiovascular:  Negative for chest pain, palpitations and leg swelling.  Gastrointestinal:  Negative for abdominal pain, constipation, diarrhea and vomiting.  Endocrine: Negative for polydipsia and polyuria.  Genitourinary:  Negative for dysuria, frequency, genital sores, vaginal bleeding and vaginal discharge.  Musculoskeletal:  Negative for arthralgias, gait problem and joint swelling.  Skin:  Negative for color change and rash.  Neurological:  Negative for dizziness, tremors, light-headedness and headaches.  Hematological:  Negative for adenopathy. Does not bruise/bleed easily.  Psychiatric/Behavioral:  Negative for dysphoric mood and sleep disturbance. The patient is not nervous/anxious.     Patient Active Problem List   Diagnosis Date Noted   Claustrophobia 03/09/2021   Motion sickness 03/09/2021   OSA on CPAP 08/04/2020   Hx of basal cell carcinoma 03/03/2020   Sleep disorder 03/03/2020   Perimenopausal symptoms 09/19/2019   Irritable bowel syndrome with diarrhea 01/21/2019   Migraine without aura and without status  migrainosus, not intractable 09/12/2017   Arthritis of ankle, right 02/23/2017   S/P breast biopsy, left 10/11/2016   Hyperlipidemia, mixed 08/24/2016   Plantar fasciitis, bilateral 04/14/2016   Iron deficiency 07/05/2015   Vitamin D deficiency 11/24/2014   Prediabetes 11/24/2014   Essential (primary) hypertension 08/06/2014   Obesity, Class III, BMI 40-49.9 (morbid obesity) ([place]) 08/06/2014    Allergies  Allergen Reactions    Bactrim [Sulfamethoxazole-Trimethoprim] Rash   Latex Rash    Past Surgical History:  Procedure Laterality Date   BREAST BIOPSY Left 10/09/2016   [facility] Biopsy- bx/clip-neg   CESAREAN SECTION  03/04/2000   CHOLECYSTECTOMY  2006   TUBAL LIGATION  2006    Social History   Tobacco Use   Smoking status: Never   Smokeless tobacco: Never  Vaping Use   Vaping Use: Never used  Substance Use Topics   Alcohol use: No    Alcohol/week: 0.0 standard drinks of alcohol   Drug use: No     Medication list has been reviewed and updated.  Current Meds  Medication Sig   Cholecalciferol (VITAMIN D) 2000 units CAPS Take 1 capsule (2,000 Units total) by mouth daily.   ferrous sulfate 325 (65 FE) MG EC tablet Take 1 tablet (325 mg total) by mouth daily with breakfast.   fluticasone (FLONASE) 50 MCG/ACT nasal spray Place 1 spray into both nostrils daily. (Patient taking differently: Place 1 spray into both nostrils as needed.)   lisinopril-hydrochlorothiazide (ZESTORETIC) 10-12.5 MG tablet Take 1 tablet by mouth daily.   Loperamide HCl (IMODIUM PO) Take by mouth as needed.   [DISCONTINUED] lisinopril-hydrochlorothiazide (ZESTORETIC) 20-25 MG tablet Take 1 tablet by mouth daily. (Patient taking differently: Take 1 tablet by mouth daily. Half tablet)       09/29/2021    9:09 AM 07/12/2021    2:34 PM 07/01/2021    1:57 PM 06/28/2021    1:34 PM  GAD 7 : Generalized Anxiety Score  Nervous, Anxious, on Edge _0 Control/stop worrying 0 1 0 1  Worry too much - different things 1 1 0 1  Trouble relaxing 0 0 0 0  Restless 1 1 0 0  Easily annoyed or irritable _1 0  Afraid - awful might happen 0 0 0 0  Total GAD 7 Score _2 Anxiety Difficulty Not difficult at all Not difficult at all  Somewhat difficult       09/29/2021    9:09 AM  Depression screen PHQ 2/9  Decreased Interest 0  Down, Depressed, Hopeless 0  PHQ - 2 Score 0  Altered sleeping 1  Tired, decreased energy 1  Change  in appetite 0  Feeling bad or failure about yourself  1  Trouble concentrating 1  Moving slowly or fidgety/restless 0  Suicidal thoughts 0  PHQ-9 Score 4  Difficult doing work/chores Not difficult at all    BP Readings from Last 3 Encounters:  09/29/21 128/78  07/12/21 118/66  07/01/21 92/62    Physical Exam Vitals and nursing note reviewed.  Constitutional:      General: She is not in acute distress.    Appearance: She is well-developed.  HENT:     Head: Normocephalic and atraumatic.     Right Ear: Tympanic membrane and ear canal normal.     Left Ear: Tympanic membrane and ear canal normal.     Nose:     Right Sinus: No maxillary sinus tenderness.  Left Sinus: No maxillary sinus tenderness.  Eyes:     General: No scleral icterus.       Right eye: No discharge.        Left eye: No discharge.     Conjunctiva/sclera: Conjunctivae normal.  Neck:     Thyroid: No thyromegaly.     Vascular: No carotid bruit.  Cardiovascular:     Rate and Rhythm: Normal rate and regular rhythm.     Pulses: Normal pulses.     Heart sounds: Normal heart sounds.  Pulmonary:     Effort: Pulmonary effort is normal. No respiratory distress.     Breath sounds: No wheezing.  Chest:  Breasts:    Right: No mass, nipple discharge, skin change or tenderness.     Left: No mass, nipple discharge, skin change or tenderness.  Abdominal:     General: Bowel sounds are normal.     Palpations: Abdomen is soft.     Tenderness: There is no abdominal tenderness.  Musculoskeletal:     Cervical back: Normal range of motion. No erythema.     Right lower leg: No edema.     Left lower leg: No edema.  Lymphadenopathy:     Cervical: No cervical adenopathy.  Skin:    General: Skin is warm and dry.     Findings: No rash.  Neurological:     Mental Status: She is alert and oriented to person, place, and time.     Cranial Nerves: No cranial nerve deficit.     Sensory: No sensory deficit.     Deep Tendon  Reflexes: Reflexes are normal and symmetric.  Psychiatric:        Attention and Perception: Attention normal.        Mood and Affect: Mood normal.     Wt Readings from Last 3 Encounters:  09/29/21 192 lb (87.1 kg)  07/12/21 215 lb (97.5 kg)  07/01/21 217 lb (98.4 kg)    BP 128/78   Pulse 78   Ht _0  (1.499 m)   Wt 192 lb (87.1 kg)   LMP 09/15/2021   SpO2 98%   BMI 38.78 kg/m   Assessment and Plan: 1. Annual physical exam Normal exam with significant weight loss with lifestyle changes. She is congratulated on the results. Up to date on screenings and immunizations.  2. Encounter for screening mammogram for breast cancer  - MM 3D SCREEN BREAST BILATERAL  3. Essential (primary) hypertension Clinically stable exam with well controlled BP. Tolerating medications without side effects at this time. Pt to continue current regimen and low sodium diet; benefits of regular exercise as able discussed. - CBC with Differential/Platelet - Comprehensive metabolic panel - TSH - POCT urinalysis dipstick - lisinopril-hydrochlorothiazide (ZESTORETIC) 10-12.5 MG tablet; Take 1 tablet by mouth daily.  Dispense: 90 tablet; Refill: 3  4. Prediabetes Should be much improved or resolved with weight loss. - Comprehensive metabolic panel - Hemoglobin A1c  5. Hyperlipidemia, mixed Check labs and advise. - Lipid panel  6. Colon cancer screening Due for colonoscopy Had one scheduled with Dr. Allen Norris 2 years ago but it was cancelled at the last minute for his last minute vacation day.  She had already taken off of work and was not notified that it was cancelled until she went for the Covid test.  She prefers another provider/office. - Ambulatory referral to Gastroenterology   Partially dictated using Editor, commissioning. Any errors are unintentional.  Halina Maidens, MD [facility]  09/29/2021

## 2021-09-30 LAB — COMPREHENSIVE METABOLIC PANEL
ALT: 10 IU/L (ref 0–32)
AST: 16 IU/L (ref 0–40)
Albumin/Globulin Ratio: 1.9 (ref 1.2–2.2)
Albumin: 4.6 g/dL (ref 3.8–4.8)
Alkaline Phosphatase: 40 IU/L — ABNORMAL LOW (ref 44–121)
BUN/Creatinine Ratio: 17 (ref 9–23)
BUN: 13 mg/dL (ref 6–24)
Bilirubin Total: 0.6 mg/dL (ref 0.0–1.2)
CO2: 22 mmol/L (ref 20–29)
Calcium: 9.9 mg/dL (ref 8.7–10.2)
Chloride: 99 mmol/L (ref 96–106)
Creatinine, Ser: 0.75 mg/dL (ref 0.57–1.00)
Globulin, Total: 2.4 g/dL (ref 1.5–4.5)
Glucose: 92 mg/dL (ref 70–99)
Potassium: 4.3 mmol/L (ref 3.5–5.2)
Sodium: 138 mmol/L (ref 134–144)
Total Protein: 7 g/dL (ref 6.0–8.5)
eGFR: 98 mL/min/{1.73_m2} (ref >59)

## 2021-09-30 LAB — CBC WITH DIFFERENTIAL/PLATELET
Basophils Absolute: 0 10*3/uL (ref 0.0–0.2)
Basos: 1 %
EOS (ABSOLUTE): 0.2 10*3/uL (ref 0.0–0.4)
Eos: 3 %
Hematocrit: 44.3 % (ref 34.0–46.6)
Hemoglobin: 15.1 g/dL (ref 11.1–15.9)
Immature Grans (Abs): 0 10*3/uL (ref 0.0–0.1)
Immature Granulocytes: 0 %
Lymphocytes Absolute: 1.8 10*3/uL (ref 0.7–3.1)
Lymphs: 35 %
MCH: 30 pg (ref 26.6–33.0)
MCHC: 34.1 g/dL (ref 31.5–35.7)
MCV: 88 fL (ref 79–97)
Monocytes Absolute: 0.4 10*3/uL (ref 0.1–0.9)
Monocytes: 7 %
Neutrophils Absolute: 2.8 10*3/uL (ref 1.4–7.0)
Neutrophils: 54 %
Platelets: 297 10*3/uL (ref 150–450)
RBC: 5.03 x10E6/uL (ref 3.77–5.28)
RDW: 11.8 % (ref 11.7–15.4)
WBC: 5.2 10*3/uL (ref 3.4–10.8)

## 2021-09-30 LAB — HEMOGLOBIN A1C
Est. average glucose Bld gHb Est-mCnc: 111 mg/dL
Hgb A1c MFr Bld: 5.5 % (ref 4.8–5.6)

## 2021-09-30 LAB — LIPID PANEL
Chol/HDL Ratio: 4.5 ratio — ABNORMAL HIGH (ref 0.0–4.4)
Cholesterol, Total: 250 mg/dL — ABNORMAL HIGH (ref 100–199)
HDL: 55 mg/dL (ref >39)
LDL Chol Calc (NIH): 175 mg/dL — ABNORMAL HIGH (ref 0–99)
Triglycerides: 113 mg/dL (ref 0–149)
VLDL Cholesterol Cal: 20 mg/dL (ref 5–40)

## 2021-09-30 LAB — TSH: TSH: 1.33 u[IU]/mL (ref 0.450–4.500)

## 2021-10-26 ENCOUNTER — Ambulatory Visit
Admission: RE | Admit: 2021-10-26 | Discharge: 2021-10-26 | Disposition: A | Discharge status: Home / Self Care | Payer: Commercial Managed Care - PPO | Source: Ambulatory Visit | Attending: Internal Medicine | Admitting: Internal Medicine

## 2021-10-26 DIAGNOSIS — Z1231 Encounter for screening mammogram for malignant neoplasm of breast: Secondary | ICD-10-CM | POA: Insufficient documentation

## 2022-03-02 ENCOUNTER — Ambulatory Visit: Payer: Self-pay | Admitting: *Deleted

## 2022-03-02 NOTE — Telephone Encounter (Signed)
Noted  Pt has an appt  KP 

## 2022-03-02 NOTE — Telephone Encounter (Signed)
having a discharge/ no monthly in 2 months   Pt has not had her menstrual cycle in 2 months.  But in the last 2 weeks she has had a brownish discharge when she wipes.  Nothing on a pad, not enough to show.  Pt wants to know if she should see her dr.  She has always been regular in th past.     Reason for Disposition  Abnormal color vaginal discharge (i.e., yellow, green, gray)  Answer Assessment - Initial Assessment Questions 1. DISCHARGE: "Describe the discharge." (e.g., white, yellow, green, gray, foamy, cottage cheese-like)     Brownish, red at times. Like at the beginning or end of a cycle. 2. ODOR: "Is there a bad odor?"     Not sure 3. ONSET: "When did the discharge begin?"     2 weeks ago 4. RASH: "Is there a rash in the genital area?" If Yes, ask: "Describe it." (e.g., redness, blisters, sores, bumps)      5. ABDOMEN PAIN: "Are you having any abdomen pain?" If Yes, ask: "What does it feel like? " (e.g., crampy, dull, intermittent, constant)      Sharp pain left side, lower stomach, at times, not daily 6. ABDOMEN PAIN SEVERITY: If present, ask: "How bad is it?" (e.g., Scale 1-10; mild, moderate, or severe)   - MILD (1-3): Doesn't interfere with normal activities, abdomen soft and not tender to touch.    - MODERATE (4-7): Interferes with normal activities or awakens from sleep, abdomen tender to touch.    - SEVERE (8-10): Excruciating pain, doubled over, unable to do any normal activities. (R/O peritonitis)      Sharp pain at times,left lower side. Had only one time yesterday 7. CAUSE: "What do you think is causing the discharge?" "Have you had the same problem before? What happened then?"     Unsure 8. OTHER SYMPTOMS: "Do you have any other symptoms?" (e.g., fever, itching, vaginal bleeding, pain with urination, injury to genital area, vaginal foreign body)     Told she has cysts on ovaries.  Protocols used: Vaginal Discharge-A-AH

## 2022-03-02 NOTE — Telephone Encounter (Signed)
Per agent: Pt having a discharge/ no monthly in 2 months   Pt has not had her menstrual cycle in 2 months.  But in the last 2 weeks she has had a brownish discharge when she wipes.  Nothing on a pad, not enough to show.  Pt wants to know if she should see her dr.  She has always been regular in th past.       Chief Complaint: Vaginal Discharge Symptoms: Brownish discharge, "Reddish at times." Sharp pain lower left quad abdomen at times, not daily "Had one yesterday, real quick." No cycle for 2 months, discharge started 2 weeks ago. Frequency: 2 weeks Pertinent Negatives: Patient denies fever, rash, odor Disposition: [] ED /[] Urgent Care (no appt availability in office) / [x] Appointment(In office/virtual)/ []  [facility]/ [] Home Care/ [] Refused Recommended Disposition /[] [facility]/ []  Follow-up with PCP Additional Notes: Secured appt for tomorrow AM. Pt states she has taken vacation time from work today, states has limited time off. Requesting if at all possible to be seen today "I would greatly appreciate it." States has surgery scheduled in Jan and limited time available to take off. Appt placed on waitlist. Lease advise. CAlled during practice lunch break.

## 2022-03-03 ENCOUNTER — Encounter: Payer: Self-pay | Admitting: Internal Medicine

## 2022-03-03 ENCOUNTER — Ambulatory Visit: Payer: Commercial Managed Care - PPO | Admitting: Internal Medicine

## 2022-03-03 VITALS — BP 110/64 | HR 69 | Ht 59.0 in | Wt 175.0 lb

## 2022-03-03 DIAGNOSIS — N926 Irregular menstruation, unspecified: Secondary | ICD-10-CM | POA: Diagnosis not present

## 2022-03-03 NOTE — Progress Notes (Signed)
Date:  03/03/2022   Name:  Nicole Vargas   DOB:  1970/06/02   MRN:  [id number]   Chief Complaint: Vaginal Discharge  Vaginal Discharge This is a new problem. The problem occurs constantly. The problem has been unchanged. The patient is experiencing no pain. She is not pregnant. Pertinent negatives include no fever. The vaginal discharge was brown and bloody. The vaginal bleeding is spotting. She has not been passing tissue. Nothing aggravates the symptoms. The treatment provided no relief. She is sexually active. It is unknown whether or not her partner has an STD. She uses nothing for contraception. Her menstrual history has been regular. Her past medical history is significant for a Cesarean section and endometriosis.  She has had normal menses every 28 days until 2 months ago.  Missed a cycle last month then started having brown vaginal discharge about 2 weeks ago that is now bright red but still light.  Wearing a light days liner only.  No pain or cramping.  Otherwise feels well.  Lab Results  Component Value Date   NA 138 09/29/2021   K 4.3 09/29/2021   CO2 22 09/29/2021   GLUCOSE 92 09/29/2021   BUN 13 09/29/2021   CREATININE 0.75 09/29/2021   CALCIUM 9.9 09/29/2021   EGFR 98 09/29/2021   GFRNONAA 108 09/19/2019   Lab Results  Component Value Date   CHOL 250 (H) 09/29/2021   HDL 55 09/29/2021   LDLCALC 175 (H) 09/29/2021   TRIG 113 09/29/2021   CHOLHDL 4.5 (H) 09/29/2021   Lab Results  Component Value Date   TSH 1.330 09/29/2021   Lab Results  Component Value Date   HGBA1C 5.5 09/29/2021   Lab Results  Component Value Date   WBC 5.2 09/29/2021   HGB 15.1 09/29/2021   HCT 44.3 09/29/2021   MCV 88 09/29/2021   PLT 297 09/29/2021   Lab Results  Component Value Date   ALT 10 09/29/2021   AST 16 09/29/2021   ALKPHOS 40 (L) 09/29/2021   BILITOT 0.6 09/29/2021   Lab Results  Component Value Date   VD25OH 34.8 10/12/2015     Review of Systems   Constitutional:  Negative for diaphoresis, fatigue and fever.  Respiratory:  Negative for chest tightness and shortness of breath.   Cardiovascular:  Negative for chest pain.  Genitourinary:  Positive for menstrual problem (missed a cycle and now only light bleeding) and vaginal bleeding.    Patient Active Problem List   Diagnosis Date Noted   Claustrophobia 03/09/2021   Motion sickness 03/09/2021   OSA on CPAP 08/04/2020   Hx of basal cell carcinoma 03/03/2020   Sleep disorder 03/03/2020   Perimenopausal symptoms 09/19/2019   Irritable bowel syndrome with diarrhea 01/21/2019   Migraine without aura and without status migrainosus, not intractable 09/12/2017   Arthritis of ankle, right 02/23/2017   S/P breast biopsy, left 10/11/2016   Hyperlipidemia, mixed 08/24/2016   Plantar fasciitis, bilateral 04/14/2016   Iron deficiency 07/05/2015   Vitamin D deficiency 11/24/2014   Prediabetes 11/24/2014   Essential (primary) hypertension 08/06/2014   Obesity, Class III, BMI 40-49.9 (morbid obesity) ([place]) 08/06/2014    Allergies  Allergen Reactions   Bactrim [Sulfamethoxazole-Trimethoprim] Rash   Latex Rash    Past Surgical History:  Procedure Laterality Date   BREAST BIOPSY Left 10/09/2016   Afffirm Biopsy- bx/clip-neg   CESAREAN SECTION  03/04/2000   CHOLECYSTECTOMY  2006   TUBAL LIGATION  2006  Social History   Tobacco Use   Smoking status: Never   Smokeless tobacco: Never  Vaping Use   Vaping Use: Never used  Substance Use Topics   Alcohol use: No    Alcohol/week: 0.0 standard drinks of alcohol   Drug use: No     Medication list has been reviewed and updated.  Current Meds  Medication Sig   Cholecalciferol (VITAMIN D) 2000 units CAPS Take 1 capsule (2,000 Units total) by mouth daily.   ferrous sulfate 325 (65 FE) MG EC tablet Take 1 tablet (325 mg total) by mouth daily with breakfast.   fluticasone (FLONASE) 50 MCG/ACT nasal spray Place 1 spray into both  nostrils daily. (Patient taking differently: Place 1 spray into both nostrils as needed.)   lisinopril-hydrochlorothiazide (ZESTORETIC) 10-12.5 MG tablet Take 1 tablet by mouth daily.   [DISCONTINUED] Loperamide HCl (IMODIUM PO) Take by mouth as needed.       09/29/2021    9:09 AM 07/12/2021    2:34 PM 07/01/2021    1:57 PM 06/28/2021    1:34 PM  GAD 7 : Generalized Anxiety Score  Nervous, Anxious, on Edge _0 Control/stop worrying 0 1 0 1  Worry too much - different things 1 1 0 1  Trouble relaxing 0 0 0 0  Restless 1 1 0 0  Easily annoyed or irritable _1 0  Afraid - awful might happen 0 0 0 0  Total GAD 7 Score _2 Anxiety Difficulty Not difficult at all Not difficult at all  Somewhat difficult       09/29/2021    9:09 AM 07/12/2021    2:34 PM 07/01/2021    1:57 PM  Depression screen PHQ 2/9  Decreased Interest 0 1 1  Down, Depressed, Hopeless 0 1 0  PHQ - 2 Score 0 2 1  Altered sleeping 1 1 0  Tired, decreased energy 1 1 0  Change in appetite 0 0 1  Feeling bad or failure about yourself  1 0 0  Trouble concentrating 1 0 0  Moving slowly or fidgety/restless 0 0 0  Suicidal thoughts 0 0 0  PHQ-9 Score _3 Difficult doing work/chores Not difficult at all Not difficult at all Not difficult at all    BP Readings from Last 3 Encounters:  03/03/22 110/64  09/29/21 128/78  07/12/21 118/66    Physical Exam Vitals and nursing note reviewed.  Constitutional:      General: She is not in acute distress.    Appearance: She is well-developed.  HENT:     Head: Normocephalic and atraumatic.  Cardiovascular:     Rate and Rhythm: Normal rate and regular rhythm.  Pulmonary:     Effort: Pulmonary effort is normal. No respiratory distress.     Breath sounds: No wheezing or rhonchi.  Abdominal:     General: Abdomen is flat.     Palpations: Abdomen is soft.     Tenderness: There is no abdominal tenderness. There is no right CVA tenderness, left CVA tenderness,  guarding or rebound.  Musculoskeletal:     Cervical back: Normal range of motion.  Skin:    General: Skin is warm and dry.     Findings: No rash.  Neurological:     Mental Status: She is alert and oriented to person, place, and time.  Psychiatric:        Mood and Affect: Mood normal.  Behavior: Behavior normal.     Wt Readings from Last 3 Encounters:  03/03/22 175 lb (79.4 kg)  09/29/21 192 lb (87.1 kg)  07/12/21 215 lb (97.5 kg)    BP 110/64   Pulse 69   Ht _0  (1.499 m)   Wt 175 lb (79.4 kg)   LMP  (LMP Unknown)   SpO2 99%   BMI 35.35 kg/m   Assessment and Plan: 1. Menstrual abnormality Likely perimenopausal.  No worrisome features. Will obtain labs. Reassurance - follow up if needed and in one month. - FSH/LH   Partially dictated using Editor, commissioning. Any errors are unintentional.  Halina Maidens, MD [facility]  03/03/2022

## 2022-03-04 LAB — FSH/LH
FSH: 31.1 m[IU]/mL
LH: 32.7 m[IU]/mL

## 2022-04-03 ENCOUNTER — Encounter: Payer: Self-pay | Admitting: Internal Medicine

## 2022-04-03 ENCOUNTER — Ambulatory Visit: Payer: Commercial Managed Care - PPO | Admitting: Internal Medicine

## 2022-04-03 VITALS — BP 94/64 | HR 74 | Ht 59.0 in | Wt 181.0 lb

## 2022-04-03 DIAGNOSIS — I10 Essential (primary) hypertension: Secondary | ICD-10-CM

## 2022-04-03 DIAGNOSIS — N951 Menopausal and female climacteric states: Secondary | ICD-10-CM | POA: Diagnosis not present

## 2022-04-03 NOTE — Progress Notes (Signed)
  Date:  04/03/2022   Name:  Nicole Vargas   DOB:  03/30/1971   MRN:  [id number]   Chief Complaint: Hypertension and Hot Flashes  Hypertension This is a chronic problem. The problem is controlled. Pertinent negatives include no chest pain, headaches, palpitations or shortness of breath. Past treatments include ACE inhibitors and diuretics (1/2 tablet). The current treatment provides significant improvement. There are no compliance problems.  There is no history of kidney disease, CAD/MI or CVA.   Menstrual irregularity - had a prolonged light cycle a  month ago, then 3 days of light bleeding and nothing since then.  Labs suggested peri-menopause.  She denies pelvic pain, discharge, hot flashes or sweats.   Lab Results  Component Value Date   NA 138 09/29/2021   K 4.3 09/29/2021   CO2 22 09/29/2021   GLUCOSE 92 09/29/2021   BUN 13 09/29/2021   CREATININE 0.75 09/29/2021   CALCIUM 9.9 09/29/2021   EGFR 98 09/29/2021   GFRNONAA 108 09/19/2019   Lab Results  Component Value Date   CHOL 250 (H) 09/29/2021   HDL 55 09/29/2021   LDLCALC 175 (H) 09/29/2021   TRIG 113 09/29/2021   CHOLHDL 4.5 (H) 09/29/2021   Lab Results  Component Value Date   TSH 1.330 09/29/2021   Lab Results  Component Value Date   HGBA1C 5.5 09/29/2021   Lab Results  Component Value Date   WBC 5.2 09/29/2021   HGB 15.1 09/29/2021   HCT 44.3 09/29/2021   MCV 88 09/29/2021   PLT 297 09/29/2021   Lab Results  Component Value Date   ALT 10 09/29/2021   AST 16 09/29/2021   ALKPHOS 40 (L) 09/29/2021   BILITOT 0.6 09/29/2021   Lab Results  Component Value Date   VD25OH 34.8 10/12/2015     Review of Systems  Constitutional:  Negative for fatigue and unexpected weight change.  HENT:  Negative for nosebleeds.   Eyes:  Negative for visual disturbance.  Respiratory:  Negative for cough, chest tightness, shortness of breath and wheezing.   Cardiovascular:  Negative for chest pain,  palpitations and leg swelling.  Gastrointestinal:  Negative for abdominal pain, constipation and diarrhea.  Genitourinary:  Negative for menstrual problem.  Neurological:  Negative for dizziness, weakness, light-headedness and headaches.    Patient Active Problem List   Diagnosis Date Noted   Claustrophobia 03/09/2021   Motion sickness 03/09/2021   OSA on CPAP 08/04/2020   Hx of basal cell carcinoma 03/03/2020   Sleep disorder 03/03/2020   Perimenopausal symptoms 09/19/2019   Irritable bowel syndrome with diarrhea 01/21/2019   Migraine without aura and without status migrainosus, not intractable 09/12/2017   Arthritis of ankle, right 02/23/2017   S/P breast biopsy, left 10/11/2016   Hyperlipidemia, mixed 08/24/2016   Plantar fasciitis, bilateral 04/14/2016   Iron deficiency 07/05/2015   Vitamin D deficiency 11/24/2014   Prediabetes 11/24/2014   Essential (primary) hypertension 08/06/2014   Obesity, Class III, BMI 40-49.9 (morbid obesity) ([facility]) 08/06/2014    Allergies  Allergen Reactions   Bactrim [Sulfamethoxazole-Trimethoprim] Rash   Latex Rash    Past Surgical History:  Procedure Laterality Date   BREAST BIOPSY Left 10/09/2016   Afffirm Biopsy- bx/clip-neg   CESAREAN SECTION  03/04/2000   CHOLECYSTECTOMY  2006   TUBAL LIGATION  2006    Social History   Tobacco Use   Smoking status: Never   Smokeless tobacco: Never  Vaping Use   Vaping Use: Never used    Substance Use Topics   Alcohol use: No    Alcohol/week: 0.0 standard drinks of alcohol   Drug use: No     Medication list has been reviewed and updated.  Current Meds  Medication Sig   Cholecalciferol (VITAMIN D) 2000 units CAPS Take 1 capsule (2,000 Units total) by mouth daily.   ferrous sulfate 325 (65 FE) MG EC tablet Take 1 tablet (325 mg total) by mouth daily with breakfast.   fluticasone (FLONASE) 50 MCG/ACT nasal spray Place 1 spray into both nostrils daily. (Patient taking differently: Place 1  spray into both nostrils as needed.)   lisinopril-hydrochlorothiazide (ZESTORETIC) 10-12.5 MG tablet Take 1 tablet by mouth daily.       03/03/2022   10:18 AM 09/29/2021    9:09 AM 07/12/2021    2:34 PM 07/01/2021    1:57 PM  GAD 7 : Generalized Anxiety Score  Nervous, Anxious, on Edge 0 1 1 1  Control/stop worrying 0 0 1 0  Worry too much - different things 1 1 1 0  Trouble relaxing 0 0 0 0  Restless 1 1 1 0  Easily annoyed or irritable 1 2 1 1  Afraid - awful might happen 0 0 0 0  Total GAD 7 Score 3 5 5 2  Anxiety Difficulty Not difficult at all Not difficult at all Not difficult at all        03/03/2022   10:18 AM 09/29/2021    9:09 AM 07/12/2021    2:34 PM  Depression screen PHQ 2/9  Decreased Interest 2 0 1  Down, Depressed, Hopeless 1 0 1  PHQ - 2 Score 3 0 2  Altered sleeping 0 1 1  Tired, decreased energy 2 1 1  Change in appetite 0 0 0  Feeling bad or failure about yourself  1 1 0  Trouble concentrating 1 1 0  Moving slowly or fidgety/restless 0 0 0  Suicidal thoughts 0 0 0  PHQ-9 Score 7 4 4  Difficult doing work/chores Not difficult at all Not difficult at all Not difficult at all    BP Readings from Last 3 Encounters:  04/03/22 94/64  03/03/22 110/64  09/29/21 128/78    Physical Exam Vitals and nursing note reviewed.  Constitutional:      General: She is not in acute distress.    Appearance: She is well-developed.  HENT:     Head: Normocephalic and atraumatic.  Cardiovascular:     Rate and Rhythm: Normal rate and regular rhythm.     Pulses: Normal pulses.     Heart sounds: No murmur heard. Pulmonary:     Effort: Pulmonary effort is normal. No respiratory distress.     Breath sounds: No wheezing or rhonchi.  Musculoskeletal:     Cervical back: Normal range of motion.  Lymphadenopathy:     Cervical: No cervical adenopathy.  Skin:    General: Skin is warm and dry.     Findings: No rash.  Neurological:     Mental Status: She is alert and  oriented to person, place, and time.  Psychiatric:        Mood and Affect: Mood normal.        Behavior: Behavior normal.     Wt Readings from Last 3 Encounters:  04/03/22 181 lb (82.1 kg)  03/03/22 175 lb (79.4 kg)  09/29/21 192 lb (87.1 kg)    BP 94/64   Pulse 74   Ht 4' 11" (1.499 m)     Wt 181 lb (82.1 kg) Comment: steel toe boots  LMP 03/15/2022 (Approximate)   SpO2 96%   BMI 36.56 kg/m   Assessment and Plan: 1. Essential (primary) hypertension Clinically stable exam with well controlled BP. Slightly low but she is not symptomatic on half tablet daily. Will continue to monitor and reduce dose if needed. Tolerating medications without side effects at this time. Pt to continue current regimen and low sodium diet; benefits of regular exercise as able discussed.  2. Perimenopausal symptoms No further bleeding; no hot flashes Will continue to monitor.   Partially dictated using Dragon software. Any errors are unintentional.  Laura Berglund, MD [facility]  04/03/2022     

## 2022-04-28 LAB — HM COLONOSCOPY

## 2022-05-10 ENCOUNTER — Encounter: Payer: Self-pay | Admitting: Family Medicine

## 2022-05-10 ENCOUNTER — Ambulatory Visit: Payer: Commercial Managed Care - PPO | Admitting: Family Medicine

## 2022-05-10 VITALS — BP 102/60 | HR 81 | Ht 59.0 in | Wt 180.0 lb

## 2022-05-10 DIAGNOSIS — N342 Other urethritis: Secondary | ICD-10-CM | POA: Diagnosis not present

## 2022-05-10 DIAGNOSIS — R300 Dysuria: Secondary | ICD-10-CM | POA: Diagnosis not present

## 2022-05-10 LAB — POCT WET PREP WITH KOH
Clue Cells Wet Prep HPF POC: NEGATIVE
KOH Prep POC: NEGATIVE
RBC Wet Prep HPF POC: NEGATIVE
Trichomonas, UA: NEGATIVE
WBC Wet Prep HPF POC: NEGATIVE
Yeast Wet Prep HPF POC: NEGATIVE

## 2022-05-10 LAB — POCT URINALYSIS DIPSTICK
Bilirubin, UA: NEGATIVE
Blood, UA: NEGATIVE
Glucose, UA: NEGATIVE
Ketones, UA: NEGATIVE
Leukocytes, UA: NEGATIVE
Nitrite, UA: NEGATIVE
Protein, UA: NEGATIVE
Spec Grav, UA: 1.015 (ref 1.010–1.025)
Urobilinogen, UA: 0.2 E.U./dL
pH, UA: 6.5 (ref 5.0–8.0)

## 2022-05-10 MED ORDER — FLUCONAZOLE 150 MG PO TABS: 150.0000 mg | ORAL_TABLET | Freq: Once | ORAL | 0 refills | Status: AC

## 2022-05-10 MED ORDER — CEPHALEXIN 500 MG PO CAPS: 500.0000 mg | ORAL_CAPSULE | Freq: Two times a day (BID) | ORAL | 0 refills | Status: DC

## 2022-05-10 NOTE — Progress Notes (Signed)
Date:  05/10/2022   Name:  Nicole Vargas   DOB:  13-Sep-1970   MRN:  [id number]   Chief Complaint: Vaginal Itching (Started x 1 week ago. Vaginal Itching. No discharge or odor. Hurting when urinating on and off. )  Vaginal Itching The patient's primary symptoms include genital itching. The patient's pertinent negatives include no genital lesions, genital odor, genital rash, missed menses, pelvic pain, vaginal bleeding or vaginal discharge. This is a chronic problem. The current episode started in the past 7 days. The problem occurs daily. Associated symptoms include dysuria and frequency. Pertinent negatives include no abdominal pain, anorexia, constipation, fever, joint swelling or nausea.    Lab Results  Component Value Date   NA 138 09/29/2021   K 4.3 09/29/2021   CO2 22 09/29/2021   GLUCOSE 92 09/29/2021   BUN 13 09/29/2021   CREATININE 0.75 09/29/2021   CALCIUM 9.9 09/29/2021   EGFR 98 09/29/2021   GFRNONAA 108 09/19/2019   Lab Results  Component Value Date   CHOL 250 (H) 09/29/2021   HDL 55 09/29/2021   LDLCALC 175 (H) 09/29/2021   TRIG 113 09/29/2021   CHOLHDL 4.5 (H) 09/29/2021   Lab Results  Component Value Date   TSH 1.330 09/29/2021   Lab Results  Component Value Date   HGBA1C 5.5 09/29/2021   Lab Results  Component Value Date   WBC 5.2 09/29/2021   HGB 15.1 09/29/2021   HCT 44.3 09/29/2021   MCV 88 09/29/2021   PLT 297 09/29/2021   Lab Results  Component Value Date   ALT 10 09/29/2021   AST 16 09/29/2021   ALKPHOS 40 (L) 09/29/2021   BILITOT 0.6 09/29/2021   Lab Results  Component Value Date   VD25OH 34.8 10/12/2015     Review of Systems  Constitutional:  Negative for fever.  Gastrointestinal:  Negative for abdominal pain, anorexia, blood in stool, constipation and nausea.  Genitourinary:  Positive for dysuria and frequency. Negative for difficulty urinating, missed menses, pelvic pain and vaginal discharge.    Patient Active  Problem List   Diagnosis Date Noted   Claustrophobia 03/09/2021   Motion sickness 03/09/2021   OSA on CPAP 08/04/2020   Hx of basal cell carcinoma 03/03/2020   Sleep disorder 03/03/2020   Perimenopausal symptoms 09/19/2019   Irritable bowel syndrome with diarrhea 01/21/2019   Migraine without aura and without status migrainosus, not intractable 09/12/2017   Arthritis of ankle, right 02/23/2017   S/P breast biopsy, left 10/11/2016   Hyperlipidemia, mixed 08/24/2016   Plantar fasciitis, bilateral 04/14/2016   Iron deficiency 07/05/2015   Vitamin D deficiency 11/24/2014   Prediabetes 11/24/2014   Essential (primary) hypertension 08/06/2014   Obesity, Class III, BMI 40-49.9 (morbid obesity) ([place]) 08/06/2014    Allergies  Allergen Reactions   Bactrim [Sulfamethoxazole-Trimethoprim] Rash   Latex Rash    Past Surgical History:  Procedure Laterality Date   BREAST BIOPSY Left 10/09/2016   Afffirm Biopsy- bx/clip-neg   CESAREAN SECTION  03/04/2000   CHOLECYSTECTOMY  2006   TUBAL LIGATION  2006    Social History   Tobacco Use   Smoking status: Never   Smokeless tobacco: Never  Vaping Use   Vaping Use: Never used  Substance Use Topics   Alcohol use: No    Alcohol/week: 0.0 standard drinks of alcohol   Drug use: No     Medication list has been reviewed and updated.  Current Meds  Medication Sig   Cholecalciferol (VITAMIN  D) 2000 units CAPS Take 1 capsule (2,000 Units total) by mouth daily.   ferrous sulfate 325 (65 FE) MG EC tablet Take 1 tablet (325 mg total) by mouth daily with breakfast.   fluticasone (FLONASE) 50 MCG/ACT nasal spray Place 1 spray into both nostrils daily. (Patient taking differently: Place 1 spray into both nostrils as needed.)   lisinopril-hydrochlorothiazide (ZESTORETIC) 10-12.5 MG tablet Take 1 tablet by mouth daily.       04/03/2022    3:38 PM 03/03/2022   10:18 AM 09/29/2021    9:09 AM 07/12/2021    2:34 PM  GAD 7 : Generalized Anxiety Score   Nervous, Anxious, on Edge 0 0 1 1  Control/stop worrying 0 0 0 1  Worry too much - different things 0 1 1 1   Trouble relaxing 0 0 0 0  Restless 1 1 1 1   Easily annoyed or irritable 1 1 2 1   Afraid - awful might happen 0 0 0 0  Total GAD 7 Score 2 3 5 5   Anxiety Difficulty Not difficult at all Not difficult at all Not difficult at all Not difficult at all       04/03/2022    3:37 PM 03/03/2022   10:18 AM 09/29/2021    9:09 AM  Depression screen PHQ 2/9  Decreased Interest 0 2 0  Down, Depressed, Hopeless 0 1 0  PHQ - 2 Score 0 3 0  Altered sleeping 2 0 1  Tired, decreased energy 1 2 1   Change in appetite 1 0 0  Feeling bad or failure about yourself  0 1 1  Trouble concentrating 0 1 1  Moving slowly or fidgety/restless 1 0 0  Suicidal thoughts 0 0 0  PHQ-9 Score 5 7 4   Difficult doing work/chores Not difficult at all Not difficult at all Not difficult at all    BP Readings from Last 3 Encounters:  05/10/22 102/60  04/03/22 94/64  03/03/22 110/64    Physical Exam Vitals and nursing note reviewed. Exam conducted with a chaperone present.  Constitutional:      General: She is not in acute distress.    Appearance: She is not diaphoretic.  HENT:     Head: Normocephalic and atraumatic.     Right Ear: Tympanic membrane and external ear normal.     Left Ear: Tympanic membrane and external ear normal.     Nose: Nose normal.     Mouth/Throat:     Mouth: Mucous membranes are moist.     Pharynx: Oropharynx is clear. No posterior oropharyngeal erythema.  Eyes:     General:        Right eye: No discharge.        Left eye: No discharge.     Conjunctiva/sclera: Conjunctivae normal.     Pupils: Pupils are equal, round, and reactive to light.  Neck:     Thyroid: No thyromegaly.     Vascular: No JVD.  Cardiovascular:     Rate and Rhythm: Normal rate and regular rhythm.     Heart sounds: Normal heart sounds, S1 normal and S2 normal. No murmur heard.    No systolic murmur is  present.     No diastolic murmur is present.     No friction rub. No gallop. No S3 or S4 sounds.  Pulmonary:     Effort: Pulmonary effort is normal.     Breath sounds: Normal breath sounds. No decreased breath sounds, wheezing, rhonchi or rales.  Abdominal:  General: Bowel sounds are normal.     Palpations: Abdomen is soft. There is no mass.     Tenderness: There is no abdominal tenderness. There is no right CVA tenderness, left CVA tenderness or guarding.  Musculoskeletal:        General: Normal range of motion.     Cervical back: Normal range of motion and neck supple.  Lymphadenopathy:     Cervical: No cervical adenopathy.  Skin:    General: Skin is warm and dry.  Neurological:     Mental Status: She is alert.     Deep Tendon Reflexes: Reflexes are normal and symmetric.     Wt Readings from Last 3 Encounters:  05/10/22 180 lb (81.6 kg)  04/03/22 181 lb (82.1 kg)  03/03/22 175 lb (79.4 kg)    BP 102/60   Pulse 81   Ht 4\' 11"  (1.499 m)   Wt 180 lb (81.6 kg)   SpO2 99%   BMI 36.36 kg/m   Assessment and Plan:  1. Dysuria New onset.  Persistent.  Stable.  No CVA tenderness no suprapubic tenderness dipstick is unremarkable this is consistent with urethral-itis. - POCT urinalysis dipstick  2. Urethritis New onset.  Persistent.  Previously was similar to when she was treated for urinary tract infection and yeast infection.  History and exam is consistent with urethral-itis we will treat with cephalexin 500 mg twice a day for 5 days and Diflucan 150 mg x 1 dosing.  Review of [place] and LH would indicate postmenopausal I suspect patient also may have atrophic vaginitis with the itchiness and if this should recur patient may benefit from some estradiol cream. - cephALEXin (KEFLEX) 500 MG capsule; Take 1 capsule (500 mg total) by mouth 2 (two) times daily.  Dispense: 10 capsule; Refill: 0 - fluconazole (DIFLUCAN) 150 MG tablet; Take 1 tablet (150 mg total) by mouth once for 1  dose.  Dispense: 1 tablet; Refill: 0   Patient had colonoscopy 2 weeks ago we called on cytology report was not available yet. Otilio Miu, MD

## 2022-05-26 ENCOUNTER — Encounter: Payer: Self-pay | Admitting: Internal Medicine

## 2022-09-04 ENCOUNTER — Ambulatory Visit: Payer: Commercial Managed Care - PPO | Admitting: Internal Medicine

## 2022-09-04 ENCOUNTER — Encounter: Payer: Self-pay | Admitting: Internal Medicine

## 2022-09-04 VITALS — BP 124/72 | HR 74 | Ht 59.0 in | Wt 187.8 lb

## 2022-09-04 DIAGNOSIS — I10 Essential (primary) hypertension: Secondary | ICD-10-CM

## 2022-09-04 DIAGNOSIS — G4733 Obstructive sleep apnea (adult) (pediatric): Secondary | ICD-10-CM

## 2022-09-04 DIAGNOSIS — R5383 Other fatigue: Secondary | ICD-10-CM

## 2022-09-04 NOTE — Assessment & Plan Note (Signed)
Initially benefited from CPAP use Now has good compliance and sleeps well but wakes tired and goes to be tired Keeping up with job duties May need to consider Nuvigil or sleep med referral.

## 2022-09-04 NOTE — Progress Notes (Signed)
Date:  09/04/2022   Name:  Nicole Vargas   DOB:  07-02-1970   MRN:  [id number]   Chief Complaint: Fatigue (Started about a year ago. Getting worse. No other symptoms besides feeling tired all the time. Patient said she sleeps 7 hrs a night with her CPAP machine. Patient has had CPAP machine for a few years now. )  HPI  Lab Results  Component Value Date   NA 138 09/29/2021   K 4.3 09/29/2021   CO2 22 09/29/2021   GLUCOSE 92 09/29/2021   BUN 13 09/29/2021   CREATININE 0.75 09/29/2021   CALCIUM 9.9 09/29/2021   EGFR 98 09/29/2021   GFRNONAA 108 09/19/2019   Lab Results  Component Value Date   CHOL 250 (H) 09/29/2021   HDL 55 09/29/2021   LDLCALC 175 (H) 09/29/2021   TRIG 113 09/29/2021   CHOLHDL 4.5 (H) 09/29/2021   Lab Results  Component Value Date   TSH 1.330 09/29/2021   Lab Results  Component Value Date   HGBA1C 5.5 09/29/2021   Lab Results  Component Value Date   WBC 5.2 09/29/2021   HGB 15.1 09/29/2021   HCT 44.3 09/29/2021   MCV 88 09/29/2021   PLT 297 09/29/2021   Lab Results  Component Value Date   ALT 10 09/29/2021   AST 16 09/29/2021   ALKPHOS 40 (L) 09/29/2021   BILITOT 0.6 09/29/2021   Lab Results  Component Value Date   VD25OH 34.8 10/12/2015     Review of Systems  Constitutional:  Positive for fatigue (and daytime somnolence). Negative for appetite change and unexpected weight change.  HENT:  Negative for nosebleeds.   Eyes:  Negative for visual disturbance.  Respiratory:  Negative for cough, chest tightness, shortness of breath and wheezing.   Cardiovascular:  Negative for chest pain, palpitations and leg swelling.  Gastrointestinal:  Negative for abdominal pain, constipation and diarrhea.  Musculoskeletal:  Negative for arthralgias and gait problem.  Neurological:  Negative for dizziness, weakness, light-headedness and headaches.  Psychiatric/Behavioral:  Negative for dysphoric mood and sleep disturbance. The patient is not  nervous/anxious.     Patient Active Problem List   Diagnosis Date Noted   Claustrophobia 03/09/2021   Motion sickness 03/09/2021   OSA on CPAP 08/04/2020   Hx of basal cell carcinoma 03/03/2020   Sleep disorder 03/03/2020   Perimenopausal symptoms 09/19/2019   Irritable bowel syndrome with diarrhea 01/21/2019   Migraine without aura and without status migrainosus, not intractable 09/12/2017   Arthritis of ankle, right 02/23/2017   S/P breast biopsy, left 10/11/2016   Hyperlipidemia, mixed 08/24/2016   Plantar fasciitis, bilateral 04/14/2016   Iron deficiency 07/05/2015   Vitamin D deficiency 11/24/2014   Prediabetes 11/24/2014   Essential (primary) hypertension 08/06/2014   Obesity, Class III, BMI 40-49.9 (morbid obesity) (HCC) 08/06/2014    Allergies  Allergen Reactions   Bactrim [Sulfamethoxazole-Trimethoprim] Rash   Latex Rash    Past Surgical History:  Procedure Laterality Date   BREAST BIOPSY Left 10/09/2016   Afffirm Biopsy- bx/clip-neg   CESAREAN SECTION  03/04/2000   CHOLECYSTECTOMY  2006   TUBAL LIGATION  2006    Social History   Tobacco Use   Smoking status: Never   Smokeless tobacco: Never  Vaping Use   Vaping Use: Never used  Substance Use Topics   Alcohol use: No    Alcohol/week: 0.0 standard drinks of alcohol   Drug use: No     Medication list has  been reviewed and updated.  No outpatient medications have been marked as taking for the 09/04/22 encounter (Office Visit) with Reubin Milan, MD.       09/04/2022    3:34 PM 04/03/2022    3:38 PM 03/03/2022   10:18 AM 09/29/2021    9:09 AM  GAD 7 : Generalized Anxiety Score  Nervous, Anxious, on Edge 0 0 0 1  Control/stop worrying 0 0 0 0  Worry too much - different things 1 0 1 1  Trouble relaxing 0 0 0 0  Restless 0 1 1 1   Easily annoyed or irritable 2 1 1 2   Afraid - awful might happen 0 0 0 0  Total GAD 7 Score 3 2 3 5   Anxiety Difficulty Not difficult at all Not difficult at all  Not difficult at all Not difficult at all       09/04/2022    3:34 PM 04/03/2022    3:37 PM 03/03/2022   10:18 AM  Depression screen PHQ 2/9  Decreased Interest 0 0 2  Down, Depressed, Hopeless 0 0 1  PHQ - 2 Score 0 0 3  Altered sleeping 0 2 0  Tired, decreased energy 2 1 2   Change in appetite 1 1 0  Feeling bad or failure about yourself  0 0 1  Trouble concentrating 0 0 1  Moving slowly or fidgety/restless 0 1 0  Suicidal thoughts 0 0 0  PHQ-9 Score 3 5 7   Difficult doing work/chores Not difficult at all Not difficult at all Not difficult at all    BP Readings from Last 3 Encounters:  09/04/22 124/72  05/10/22 102/60  04/03/22 94/64    Physical Exam Vitals and nursing note reviewed.  Constitutional:      General: She is not in acute distress.    Appearance: She is well-developed.  HENT:     Head: Normocephalic and atraumatic.  Neck:     Vascular: No carotid bruit.  Cardiovascular:     Rate and Rhythm: Normal rate and regular rhythm.  Pulmonary:     Effort: Pulmonary effort is normal. No respiratory distress.     Breath sounds: No wheezing or rhonchi.  Musculoskeletal:     Cervical back: Normal range of motion.     Right lower leg: No edema.     Left lower leg: No edema.  Lymphadenopathy:     Cervical: No cervical adenopathy.  Skin:    General: Skin is warm and dry.     Findings: No rash.  Neurological:     Mental Status: She is alert and oriented to person, place, and time.  Psychiatric:        Mood and Affect: Mood normal.        Behavior: Behavior normal.     Wt Readings from Last 3 Encounters:  09/04/22 187 lb 12.8 oz (85.2 kg)  05/10/22 180 lb (81.6 kg)  04/03/22 181 lb (82.1 kg)    BP 124/72   Pulse 74   Ht 4\' 11"  (1.499 m)   Wt 187 lb 12.8 oz (85.2 kg)   SpO2 98%   BMI 37.93 kg/m   Assessment and Plan:  Problem List Items Addressed This Visit     Essential (primary) hypertension - Primary (Chronic)    Stable exam with well  controlled BP.  Currently taking lisinopril and hctz. Tolerating medications without concerns or side effects. Will continue to recommend low sodium diet and current regimen.  OSA on CPAP (Chronic)    Initially benefited from CPAP use Now has good compliance and sleeps well but wakes tired and goes to be tired Keeping up with job duties May need to consider Nuvigil or sleep med referral.      Other Visit Diagnoses     Fatigue, unspecified type       Relevant Orders   CBC with Differential/Platelet   TSH + free T4   Comprehensive metabolic panel       No follow-ups on file.   Partially dictated using Dragon software, any errors are not intentional.  Reubin Milan, MD [facility] [place], [place]

## 2022-09-04 NOTE — Assessment & Plan Note (Signed)
Stable exam with well controlled BP.  Currently taking lisinopril and hctz. Tolerating medications without concerns or side effects. Will continue to recommend low sodium diet and current regimen.

## 2022-09-04 NOTE — Patient Instructions (Signed)
-  It was a pleasure to see you today! Please review your visit summary for helpful information. -Lab results are usually available within 1-2 days and we will call once reviewed. -I would encourage you to follow your care via MyChart where you can access lab results, notes, messages, and more. -If you feel that we did a nice job today, please complete your after-visit survey and leave us a Google review! Your CMA today was Journi Moffa and your provider was Dr Laura Berglund, MD.  

## 2022-09-05 LAB — CBC WITH DIFFERENTIAL/PLATELET
Basophils Absolute: 0 10*3/uL (ref 0.0–0.2)
Basos: 0 %
EOS (ABSOLUTE): 0.2 10*3/uL (ref 0.0–0.4)
Eos: 3 %
Hematocrit: 43.6 % (ref 34.0–46.6)
Hemoglobin: 14.6 g/dL (ref 11.1–15.9)
Immature Grans (Abs): 0 10*3/uL (ref 0.0–0.1)
Immature Granulocytes: 0 %
Lymphocytes Absolute: 2.1 10*3/uL (ref 0.7–3.1)
Lymphs: 29 %
MCH: 30.7 pg (ref 26.6–33.0)
MCHC: 33.5 g/dL (ref 31.5–35.7)
MCV: 92 fL (ref 79–97)
Monocytes Absolute: 0.5 10*3/uL (ref 0.1–0.9)
Monocytes: 7 %
Neutrophils Absolute: 4.3 10*3/uL (ref 1.4–7.0)
Neutrophils: 61 %
Platelets: 245 10*3/uL (ref 150–450)
RBC: 4.75 x10E6/uL (ref 3.77–5.28)
RDW: 11.6 % — ABNORMAL LOW (ref 11.7–15.4)
WBC: 7.1 10*3/uL (ref 3.4–10.8)

## 2022-09-05 LAB — COMPREHENSIVE METABOLIC PANEL
ALT: 12 IU/L (ref 0–32)
AST: 18 IU/L (ref 0–40)
Albumin/Globulin Ratio: 1.8 (ref 1.2–2.2)
Albumin: 4.4 g/dL (ref 3.9–4.9)
Alkaline Phosphatase: 35 IU/L — ABNORMAL LOW (ref 44–121)
BUN/Creatinine Ratio: 21 (ref 9–23)
BUN: 13 mg/dL (ref 6–24)
Bilirubin Total: 0.4 mg/dL (ref 0.0–1.2)
CO2: 21 mmol/L (ref 20–29)
Calcium: 9.8 mg/dL (ref 8.7–10.2)
Chloride: 104 mmol/L (ref 96–106)
Creatinine, Ser: 0.62 mg/dL (ref 0.57–1.00)
Globulin, Total: 2.5 g/dL (ref 1.5–4.5)
Glucose: 90 mg/dL (ref 70–99)
Potassium: 4.4 mmol/L (ref 3.5–5.2)
Sodium: 142 mmol/L (ref 134–144)
Total Protein: 6.9 g/dL (ref 6.0–8.5)
eGFR: 108 mL/min/{1.73_m2} (ref >59)

## 2022-09-05 LAB — TSH+FREE T4
Free T4: 1.31 ng/dL (ref 0.82–1.77)
TSH: 1.74 u[IU]/mL (ref 0.450–4.500)

## 2022-09-13 ENCOUNTER — Other Ambulatory Visit: Payer: Self-pay | Admitting: Internal Medicine

## 2022-09-13 DIAGNOSIS — Z1231 Encounter for screening mammogram for malignant neoplasm of breast: Secondary | ICD-10-CM

## 2022-09-27 ENCOUNTER — Encounter: Payer: Self-pay | Admitting: Internal Medicine

## 2022-09-27 ENCOUNTER — Encounter: Payer: Commercial Managed Care - PPO | Admitting: Internal Medicine

## 2022-09-28 NOTE — Progress Notes (Signed)
Error

## 2022-09-30 ENCOUNTER — Other Ambulatory Visit: Payer: Self-pay | Admitting: Internal Medicine

## 2022-09-30 DIAGNOSIS — I10 Essential (primary) hypertension: Secondary | ICD-10-CM

## 2022-10-02 NOTE — Telephone Encounter (Signed)
Requested Prescriptions  Pending Prescriptions Disp Refills   lisinopril-hydrochlorothiazide (ZESTORETIC) 10-12.5 MG tablet [Pharmacy Med Name: LISINOPRIL-HCTZ 10-12.5 MG TAB] 90 tablet 1    Sig: TAKE 1 TABLET BY MOUTH EVERY DAY     Cardiovascular:  ACEI + Diuretic Combos Passed - 09/30/2022  8:42 AM      Passed - Na in normal range and within 180 days    Sodium  Date Value Ref Range Status  09/04/2022 142 134 - 144 mmol/L Final         Passed - K in normal range and within 180 days    Potassium  Date Value Ref Range Status  09/04/2022 4.4 3.5 - 5.2 mmol/L Final         Passed - Cr in normal range and within 180 days    Creatinine, Ser  Date Value Ref Range Status  09/04/2022 0.62 0.57 - 1.00 mg/dL Final         Passed - eGFR is 30 or above and within 180 days    GFR calc Af Amer  Date Value Ref Range Status  09/19/2019 125 >59 mL/min/1.73 Final    Comment:    **[facility] currently reports eGFR in compliance with the current**   recommendations of the [employer]. [facility] will   update reporting as new guidelines are published from the NKF-ASN   Task force.    GFR calc non Af Amer  Date Value Ref Range Status  09/19/2019 108 >59 mL/min/1.73 Final   eGFR  Date Value Ref Range Status  09/04/2022 108 >59 mL/min/1.73 Final         Passed - Patient is not pregnant      Passed - Last BP in normal range    BP Readings from Last 1 Encounters:  09/04/22 124/72         Passed - Valid encounter within last 6 months    Recent Outpatient Visits           4 weeks ago Essential (primary) hypertension   [place] [facility] at [facility], Nyoka Cowden, MD   4 months ago Dysuria   [facility] at [facility], [facility]   6 months ago Essential (primary) hypertension   [place] [facility] at [facility], Nyoka Cowden, MD   7 months ago Menstrual  abnormality   [facility] at [facility], Nyoka Cowden, MD   1 year ago Annual physical exam   [facility] at [facility], Nyoka Cowden, MD       Future Appointments             In 5 months Judithann Graves, Nyoka Cowden, MD [facility] Medicine at [facility], [facility]

## 2022-11-14 ENCOUNTER — Ambulatory Visit
Admission: RE | Admit: 2022-11-14 | Discharge: 2022-11-14 | Disposition: A | Discharge status: Home / Self Care | Payer: Commercial Managed Care - PPO | Source: Ambulatory Visit | Attending: Internal Medicine | Admitting: Internal Medicine

## 2022-11-14 DIAGNOSIS — Z1231 Encounter for screening mammogram for malignant neoplasm of breast: Secondary | ICD-10-CM | POA: Diagnosis not present

## 2022-11-27 ENCOUNTER — Ambulatory Visit (INDEPENDENT_AMBULATORY_CARE_PROVIDER_SITE_OTHER): Payer: Commercial Managed Care - PPO | Admitting: Internal Medicine

## 2022-11-27 ENCOUNTER — Encounter: Payer: Self-pay | Admitting: Internal Medicine

## 2022-11-27 VITALS — BP 128/70 | HR 82 | Ht 59.0 in | Wt 190.0 lb

## 2022-11-27 DIAGNOSIS — N907 Vulvar cyst: Secondary | ICD-10-CM

## 2022-11-27 MED ORDER — CEPHALEXIN 500 MG PO CAPS: 500.0000 mg | ORAL_CAPSULE | Freq: Four times a day (QID) | ORAL | 0 refills | Status: AC

## 2022-11-27 NOTE — Patient Instructions (Signed)
Use hot soaks daily until resolved

## 2022-11-27 NOTE — Progress Notes (Signed)
Date:  11/27/2022   Name:  Nicole Vargas   DOB:  29-Jul-1970   MRN:  [id number]   Chief Complaint: Spots in Vagina (Painful "lumps" in Vagina. Felt this when wiping. Painful when touching.)  HPI  Lab Results  Component Value Date   NA 142 09/04/2022   K 4.4 09/04/2022   CO2 21 09/04/2022   GLUCOSE 90 09/04/2022   BUN 13 09/04/2022   CREATININE 0.62 09/04/2022   CALCIUM 9.8 09/04/2022   EGFR 108 09/04/2022   GFRNONAA 108 09/19/2019   Lab Results  Component Value Date   CHOL 250 (H) 09/29/2021   HDL 55 09/29/2021   LDLCALC 175 (H) 09/29/2021   TRIG 113 09/29/2021   CHOLHDL 4.5 (H) 09/29/2021   Lab Results  Component Value Date   TSH 1.740 09/04/2022   Lab Results  Component Value Date   HGBA1C 5.5 09/29/2021   Lab Results  Component Value Date   WBC 7.1 09/04/2022   HGB 14.6 09/04/2022   HCT 43.6 09/04/2022   MCV 92 09/04/2022   PLT 245 09/04/2022   Lab Results  Component Value Date   ALT 12 09/04/2022   AST 18 09/04/2022   ALKPHOS 35 (L) 09/04/2022   BILITOT 0.4 09/04/2022   Lab Results  Component Value Date   VD25OH 34.8 10/12/2015     Review of Systems  Patient Active Problem List   Diagnosis Date Noted   Claustrophobia 03/09/2021   Motion sickness 03/09/2021   OSA on CPAP 08/04/2020   Hx of basal cell carcinoma 03/03/2020   Sleep disorder 03/03/2020   Perimenopausal symptoms 09/19/2019   Irritable bowel syndrome with diarrhea 01/21/2019   Migraine without aura and without status migrainosus, not intractable 09/12/2017   Arthritis of ankle, right 02/23/2017   S/P breast biopsy, left 10/11/2016   Hyperlipidemia, mixed 08/24/2016   Plantar fasciitis, bilateral 04/14/2016   Iron deficiency 07/05/2015   Vitamin D deficiency 11/24/2014   Prediabetes 11/24/2014   Essential (primary) hypertension 08/06/2014   Obesity, Class III, BMI 40-49.9 (morbid obesity) (HCC) 08/06/2014    Allergies  Allergen Reactions   Bactrim  [Sulfamethoxazole-Trimethoprim] Rash   Latex Rash    Past Surgical History:  Procedure Laterality Date   BREAST BIOPSY Left 10/09/2016   Afffirm Biopsy- bx/clip-neg   CESAREAN SECTION  03/04/2000   CHOLECYSTECTOMY  2006   TUBAL LIGATION  2006    Social History   Tobacco Use   Smoking status: Never   Smokeless tobacco: Never  Vaping Use   Vaping status: Never Used  Substance Use Topics   Alcohol use: No    Alcohol/week: 0.0 standard drinks of alcohol   Drug use: No     Medication list has been reviewed and updated.  Current Meds  Medication Sig   cephALEXin (KEFLEX) 500 MG capsule Take 1 capsule (500 mg total) by mouth 4 (four) times daily for 10 days.   Cholecalciferol (VITAMIN D) 2000 units CAPS Take 1 capsule (2,000 Units total) by mouth daily.   ferrous sulfate 325 (65 FE) MG EC tablet Take 1 tablet (325 mg total) by mouth daily with breakfast.   fluticasone (FLONASE) 50 MCG/ACT nasal spray Place 1 spray into both nostrils daily. (Patient taking differently: Place 1 spray into both nostrils as needed.)   lisinopril-hydrochlorothiazide (ZESTORETIC) 10-12.5 MG tablet TAKE 1 TABLET BY MOUTH EVERY DAY       09/04/2022    3:34 PM 04/03/2022    3:38 PM 03/03/2022  10:18 AM 09/29/2021    9:09 AM  GAD 7 : Generalized Anxiety Score  Nervous, Anxious, on Edge 0 0 0 1  Control/stop worrying 0 0 0 0  Worry too much - different things 1 0 1 1  Trouble relaxing 0 0 0 0  Restless 0 1 1 1   Easily annoyed or irritable 2 1 1 2   Afraid - awful might happen 0 0 0 0  Total GAD 7 Score 3 2 3 5   Anxiety Difficulty Not difficult at all Not difficult at all Not difficult at all Not difficult at all       09/04/2022    3:34 PM 04/03/2022    3:37 PM 03/03/2022   10:18 AM  Depression screen PHQ 2/9  Decreased Interest 0 0 2  Down, Depressed, Hopeless 0 0 1  PHQ - 2 Score 0 0 3  Altered sleeping 0 2 0  Tired, decreased energy 2 1 2   Change in appetite 1 1 0  Feeling bad or  failure about yourself  0 0 1  Trouble concentrating 0 0 1  Moving slowly or fidgety/restless 0 1 0  Suicidal thoughts 0 0 0  PHQ-9 Score 3 5 7   Difficult doing work/chores Not difficult at all Not difficult at all Not difficult at all    BP Readings from Last 3 Encounters:  11/27/22 128/70  09/04/22 124/72  05/10/22 102/60    Physical Exam Vitals and nursing note reviewed.  Constitutional:      General: She is not in acute distress.    Appearance: She is well-developed.  HENT:     Head: Normocephalic and atraumatic.  Pulmonary:     Effort: Pulmonary effort is normal. No respiratory distress.  Genitourinary:    Pubic Area: No rash.      Labia:        Right: Lesion present.        Left: No rash, tenderness or lesion.        Comments: 1 cm labial cyst with central pore - no drainage or bleeding; minimally tender Lymphadenopathy:     Lower Body: No right inguinal adenopathy. No left inguinal adenopathy.  Skin:    General: Skin is warm and dry.     Findings: No rash.  Neurological:     Mental Status: She is alert and oriented to person, place, and time.  Psychiatric:        Mood and Affect: Mood normal.        Behavior: Behavior normal.     Wt Readings from Last 3 Encounters:  11/27/22 190 lb (86.2 kg)  09/04/22 187 lb 12.8 oz (85.2 kg)  05/10/22 180 lb (81.6 kg)    BP 128/70   Pulse 82   Ht 4\' 11"  (1.499 m)   Wt 190 lb (86.2 kg)   SpO2 98%   BMI 38.38 kg/m   Assessment and Plan:  Problem List Items Addressed This Visit   None Visit Diagnoses     Labial cyst    -  Primary   recommend hot soaks and antibiotics if worsening, may need I&D   Relevant Medications   cephALEXin (KEFLEX) 500 MG capsule       No follow-ups on file.    Reubin Milan, MD [facility] Medicine [place]

## 2023-02-12 ENCOUNTER — Ambulatory Visit: Payer: Self-pay

## 2023-02-12 ENCOUNTER — Telehealth (INDEPENDENT_AMBULATORY_CARE_PROVIDER_SITE_OTHER): Payer: Commercial Managed Care - PPO | Admitting: Internal Medicine

## 2023-02-12 ENCOUNTER — Encounter: Payer: Self-pay | Admitting: Internal Medicine

## 2023-02-12 VITALS — Ht 59.0 in | Wt 190.0 lb

## 2023-02-12 DIAGNOSIS — U071 COVID-19: Secondary | ICD-10-CM | POA: Diagnosis not present

## 2023-02-12 NOTE — Patient Instructions (Addendum)
Coricidin for congestion if needed.  Take Tylenol 650 - 1000 mg every 6-8 hours for fever, body aches and headache. Drink plenty of fluids with electrolytes. Monitor for fever that does not decrease and/or shortness of breath that worsens or is present at rest. Quarantine for 5 days.

## 2023-02-12 NOTE — Progress Notes (Signed)
Date:  02/12/2023   Name:  Nicole Vargas   DOB:  [birth year]   MRN:  [id number]  This encounter was conducted via video encounter. This platform was deemed appropriate for the issues to be addressed.  The patient was correctly identified.  I advised that I am conducting the visit from a secure room in my office at [facility] clinic.  The patient is located at home. The limitations of this form of encounter were discussed with the patient and he/she agreed to proceed.  Some vital signs will be absent.  Chief Complaint: Covid Positive (Tested Positive this morning. Symptoms started 2 days ago. Patient has symptoms of cough, sneezing, headache, and sinus pressure. Fever Saturday. But none since. )  Cough Associated symptoms include chills, headaches and nasal congestion. Pertinent negatives include no chest pain, shortness of breath or wheezing.  URI  This is a new problem. The current episode started yesterday. The problem has been unchanged. Associated symptoms include congestion, coughing, headaches and sinus pain. Pertinent negatives include no abdominal pain, chest pain, diarrhea, nausea, vomiting or wheezing. She has tried acetaminophen for the symptoms.    Review of Systems  Constitutional:  Positive for chills.  HENT:  Positive for congestion and sinus pain.   Respiratory:  Positive for cough. Negative for chest tightness, shortness of breath and wheezing.   Cardiovascular:  Negative for chest pain and palpitations.  Gastrointestinal:  Negative for abdominal pain, diarrhea, nausea and vomiting.  Neurological:  Positive for headaches.  Psychiatric/Behavioral:  Negative for dysphoric mood and sleep disturbance. The patient is not nervous/anxious.      Lab Results  Component Value Date   NA 142 09/04/2022   K 4.4 09/04/2022   CO2 21 09/04/2022   GLUCOSE 90 09/04/2022   BUN 13 09/04/2022   CREATININE 0.62 09/04/2022   CALCIUM 9.8 09/04/2022   EGFR 108 09/04/2022    GFRNONAA 108 09/19/2019   Lab Results  Component Value Date   CHOL 250 (H) 09/29/2021   HDL 55 09/29/2021   LDLCALC 175 (H) 09/29/2021   TRIG 113 09/29/2021   CHOLHDL 4.5 (H) 09/29/2021   Lab Results  Component Value Date   TSH 1.740 09/04/2022   Lab Results  Component Value Date   HGBA1C 5.5 09/29/2021   Lab Results  Component Value Date   WBC 7.1 09/04/2022   HGB 14.6 09/04/2022   HCT 43.6 09/04/2022   MCV 92 09/04/2022   PLT 245 09/04/2022   Lab Results  Component Value Date   ALT 12 09/04/2022   AST 18 09/04/2022   ALKPHOS 35 (L) 09/04/2022   BILITOT 0.4 09/04/2022   Lab Results  Component Value Date   VD25OH 34.8 10/12/2015     Patient Active Problem List   Diagnosis Date Noted   Claustrophobia 03/09/2021   Motion sickness 03/09/2021   OSA on CPAP 08/04/2020   Hx of basal cell carcinoma 03/03/2020   Sleep disorder 03/03/2020   Perimenopausal symptoms 09/19/2019   Irritable bowel syndrome with diarrhea 01/21/2019   Migraine without aura and without status migrainosus, not intractable 09/12/2017   Arthritis of ankle, right 02/23/2017   S/P breast biopsy, left 10/11/2016   Hyperlipidemia, mixed 08/24/2016   Plantar fasciitis, bilateral 04/14/2016   Iron deficiency 07/05/2015   Vitamin D deficiency 11/24/2014   Prediabetes 11/24/2014   Essential (primary) hypertension 08/06/2014   Obesity, Class III, BMI 40-49.9 (morbid obesity) (HCC) 08/06/2014    Allergies  Allergen Reactions  Bactrim [Sulfamethoxazole-Trimethoprim] Rash   Latex Rash    Past Surgical History:  Procedure Laterality Date   BREAST BIOPSY Left 10/09/2016   Afffirm Biopsy- bx/clip-neg   CESAREAN SECTION  03/04/2000   CHOLECYSTECTOMY  2006   TUBAL LIGATION  2006    Social History   Tobacco Use   Smoking status: Never   Smokeless tobacco: Never  Vaping Use   Vaping status: Never Used  Substance Use Topics   Alcohol use: No    Alcohol/week: 0.0 standard drinks of alcohol    Drug use: No     Medication list has been reviewed and updated.  Current Meds  Medication Sig   Cholecalciferol (VITAMIN D) 2000 units CAPS Take 1 capsule (2,000 Units total) by mouth daily.   ferrous sulfate 325 (65 FE) MG EC tablet Take 1 tablet (325 mg total) by mouth daily with breakfast.   fluticasone (FLONASE) 50 MCG/ACT nasal spray Place 1 spray into both nostrils daily. (Patient taking differently: Place 1 spray into both nostrils as needed.)   lisinopril-hydrochlorothiazide (ZESTORETIC) 10-12.5 MG tablet TAKE 1 TABLET BY MOUTH EVERY DAY       02/12/2023   11:15 AM 09/04/2022    3:34 PM 04/03/2022    3:38 PM 03/03/2022   10:18 AM  GAD 7 : Generalized Anxiety Score  Nervous, Anxious, on Edge 0 0 0 0  Control/stop worrying 0 0 0 0  Worry too much - different things 0 1 0 1  Trouble relaxing 0 0 0 0  Restless 0 0 1 1  Easily annoyed or irritable 0 2 1 1   Afraid - awful might happen 0 0 0 0  Total GAD 7 Score 0 3 2 3   Anxiety Difficulty Not difficult at all Not difficult at all Not difficult at all Not difficult at all       02/12/2023   11:15 AM 09/04/2022    3:34 PM 04/03/2022    3:37 PM  Depression screen PHQ 2/9  Decreased Interest 0 0 0  Down, Depressed, Hopeless 0 0 0  PHQ - 2 Score 0 0 0  Altered sleeping 0 0 2  Tired, decreased energy 0 2 1  Change in appetite 0 1 1  Feeling bad or failure about yourself  0 0 0  Trouble concentrating 0 0 0  Moving slowly or fidgety/restless 0 0 1  Suicidal thoughts 0 0 0  PHQ-9 Score 0 3 5  Difficult doing work/chores Not difficult at all Not difficult at all Not difficult at all    BP Readings from Last 3 Encounters:  11/27/22 128/70  09/04/22 124/72  05/10/22 102/60    Physical Exam Constitutional:      Appearance: Normal appearance.  Pulmonary:     Effort: Pulmonary effort is normal.  Neurological:     General: No focal deficit present.     Mental Status: She is alert.  Psychiatric:        Mood and  Affect: Mood normal.        Behavior: Behavior normal.     Wt Readings from Last 3 Encounters:  02/12/23 190 lb (86.2 kg)  11/27/22 190 lb (86.2 kg)  09/04/22 187 lb 12.8 oz (85.2 kg)    Ht 4\' 11"  (1.499 m)   Wt 190 lb (86.2 kg)   BMI 38.38 kg/m   Assessment and Plan:  Problem List Items Addressed This Visit   None Visit Diagnoses     COVID-19 virus infection    -  Primary   not at high risk for complications Recommend fluids, rest and tylenol out of work for 5 days - letter written      I spent 8 minutes on this encounter, 100% via video. No follow-ups on file.    Reubin Milan, MD [facility] [place]

## 2023-02-12 NOTE — Telephone Encounter (Signed)
  Chief Complaint: COVID+ Symptoms: Congestion, HA, BA, Chills, cough Frequency: Friday night /Saturday morning Pertinent Negatives: Patient denies Symptoms are mild Disposition: [] ED /[] Urgent Care (no appt availability in office) / [x] Appointment(In office/virtual)/ []  [facility]/ [] Home Care/ [] Refused Recommended Disposition /[] [facility]/ []  Follow-up with PCP Additional Notes: Pt has mild s/s at this time. Cough, fever has resolved, HA, congestion and chills. Pt would like to speak with Dr. Judithann Graves to see if antivirals are appropriate.  VV scheduled.  Summary: Positive for covid, seeking Rx   Pt called to report that she has tested positive for covid, is concerned if she should take paxlovid? Has chills, headache, coughing.  Best contact:  [phone]     Reason for Disposition  [1] HIGH RISK patient (e.g., weak immune system, age > 64 years, obesity with BMI 30 or higher, pregnant, chronic lung disease or other chronic medical condition) AND [2] COVID symptoms (e.g., cough, fever)  (Exceptions: Already seen by PCP and no new or worsening symptoms.)  Answer Assessment - Initial Assessment Questions 1. COVID-19 DIAGNOSIS: "How do you know that you have COVID?" (e.g., positive lab test or self-test, diagnosed by doctor or NP/PA, symptoms after exposure).     Home test - Today 2. COVID-19 EXPOSURE: "Was there any known exposure to COVID before the symptoms began?" CDC Definition of close contact: within 6 feet (2 meters) for a total of 15 minutes or more over a 24-hour period.      Yes - son and husband 3. ONSET: "When did the COVID-19 symptoms start?"      Saturday 4. WORST SYMPTOM: "What is your worst symptom?" (e.g., cough, fever, shortness of breath, muscle aches)     Congestion 5. COUGH: "Do you have a cough?" If Yes, ask: "How bad is the cough?"       yes 6. FEVER: "Do you have a fever?" If Yes, ask: "What is your temperature, how was it measured, and  when did it start?"     Off and on - not today 7. RESPIRATORY STATUS: "Describe your breathing?" (e.g., normal; shortness of breath, wheezing, unable to speak)      no 8. BETTER-SAME-WORSE: "Are you getting better, staying the same or getting worse compared to yesterday?"  If getting worse, ask, "In what way?"     better 9. OTHER SYMPTOMS: "Do you have any other symptoms?"  (e.g., chills, fatigue, headache, loss of smell or taste, muscle pain, sore throat)     Chills, HA, coughing, congestion HA 10. HIGH RISK DISEASE: "Do you have any chronic medical problems?" (e.g., asthma, heart or lung disease, weak immune system, obesity, etc.)       HTN  Protocols used: Coronavirus (COVID-19) Diagnosed or Suspected-A-AH

## 2023-02-15 ENCOUNTER — Telehealth: Payer: Self-pay | Admitting: Internal Medicine

## 2023-02-15 ENCOUNTER — Encounter: Payer: Self-pay | Admitting: Internal Medicine

## 2023-02-15 NOTE — Telephone Encounter (Signed)
Patient informed. Letter in my chart. Patient said her job is requiring FMLA due to being out for so long. Told her we can complete these forms when we receive them since she has Covid.  - Nicole Vargas

## 2023-02-15 NOTE — Telephone Encounter (Signed)
Pt is calling to request if return to work note can be extended to Monday 02/19/23. Please advise CB- [phone]

## 2023-02-22 ENCOUNTER — Telehealth: Payer: Self-pay | Admitting: Internal Medicine

## 2023-02-22 NOTE — Telephone Encounter (Signed)
Patient called to f/u on the FMLA paperwork as she needs it to go back to her employer. Please f/u with patient to let her know when the paper will be completed

## 2023-02-28 NOTE — Telephone Encounter (Signed)
Patient called stated her employer will be sending forms today but they will need to be completed and sent back no later than 11/14. Patient says she really needs it done and would like a call back when it has been sent

## 2023-02-28 NOTE — Telephone Encounter (Signed)
Patient informed FMLA forms have been faxed today.  - Nicole Vargas

## 2023-03-07 ENCOUNTER — Telehealth: Payer: Self-pay | Admitting: Internal Medicine

## 2023-03-07 NOTE — Telephone Encounter (Signed)
Still have not received fax. Awaiting fax.

## 2023-03-07 NOTE — Telephone Encounter (Signed)
Copied from CRM [id number]. Topic: General - Other >> Mar 07, 2023  8:28 AM Franchot Heidelberg wrote: Reason for CRM: Rosie from [facility] for [employer] called requesting additional information. Says the patient's paperwork is due by tomorrow and that she will fax over a document requesting the additional information that she needs -later this afternoon.  Best contact: [phone]

## 2023-03-09 NOTE — Telephone Encounter (Signed)
Fax not received.  KP Copied from CRM [id number]. Topic: General - Inquiry >> Mar 08, 2023 11:20 AM Nicole Vargas wrote: Reason for CRM: pt would like to know if the fax from [employer] has been received today?  Pt states it is due back to them today. Please advise and pt would like cb asap

## 2023-03-29 ENCOUNTER — Ambulatory Visit: Payer: Commercial Managed Care - PPO | Admitting: Internal Medicine

## 2023-04-04 ENCOUNTER — Other Ambulatory Visit: Payer: Self-pay | Admitting: Internal Medicine

## 2023-04-04 DIAGNOSIS — I10 Essential (primary) hypertension: Secondary | ICD-10-CM

## 2023-04-04 NOTE — Telephone Encounter (Signed)
Requested medication (s) are due for refill today: yes   Requested medication (s) are on the active medication list: yes   Last refill:  10/02/22 #90 1 refills  Future visit scheduled: yes in 2 weeks   Notes to clinic:   protocol failed last labs 09/04/22. Do you want to refill prior to appt?     Requested Prescriptions  Pending Prescriptions Disp Refills   lisinopril-hydrochlorothiazide (ZESTORETIC) 10-12.5 MG tablet [Pharmacy Med Name: LISINOPRIL-HCTZ 10-12.5 MG TAB] 90 tablet 1    Sig: TAKE 1 TABLET BY MOUTH EVERY DAY     Cardiovascular:  ACEI + Diuretic Combos Failed - 04/04/2023  1:30 AM      Failed - Na in normal range and within 180 days    Sodium  Date Value Ref Range Status  09/04/2022 142 134 - 144 mmol/L Final         Failed - K in normal range and within 180 days    Potassium  Date Value Ref Range Status  09/04/2022 4.4 3.5 - 5.2 mmol/L Final         Failed - Cr in normal range and within 180 days    Creatinine, Ser  Date Value Ref Range Status  09/04/2022 0.62 0.57 - 1.00 mg/dL Final         Failed - eGFR is 30 or above and within 180 days    GFR calc Af Amer  Date Value Ref Range Status  09/19/2019 125 >59 mL/min/1.73 Final    Comment:    **[facility] currently reports eGFR in compliance with the current**   recommendations of the [employer]. [facility] will   update reporting as new guidelines are published from the NKF-ASN   Task force.    GFR calc non Af Amer  Date Value Ref Range Status  09/19/2019 108 >59 mL/min/1.73 Final   eGFR  Date Value Ref Range Status  09/04/2022 108 >59 mL/min/1.73 Final         Passed - Patient is not pregnant      Passed - Last BP in normal range    BP Readings from Last 1 Encounters:  11/27/22 128/70         Passed - Valid encounter within last 6 months    Recent Outpatient Visits           1 month ago COVID-19 virus infection   [place] [facility] at [facility], Nyoka Cowden, MD   4 months ago Labial cyst   [facility] at [facility], Nyoka Cowden, MD   7 months ago Essential (primary) hypertension   [place] [facility] at [facility], Nyoka Cowden, MD   10 months ago Dysuria   [facility] at [facility], [facility]   1 year ago Essential (primary) hypertension   [place] [facility] at [facility], Nyoka Cowden, MD       Future Appointments             In 2 weeks Judithann Graves, Nyoka Cowden, MD [facility] at [facility], [facility]

## 2023-04-20 ENCOUNTER — Ambulatory Visit (INDEPENDENT_AMBULATORY_CARE_PROVIDER_SITE_OTHER): Payer: Commercial Managed Care - PPO | Admitting: Internal Medicine

## 2023-04-20 ENCOUNTER — Encounter: Payer: Self-pay | Admitting: Internal Medicine

## 2023-04-20 VITALS — BP 102/78 | HR 105 | Ht 59.0 in | Wt 208.0 lb

## 2023-04-20 DIAGNOSIS — N951 Menopausal and female climacteric states: Secondary | ICD-10-CM | POA: Diagnosis not present

## 2023-04-20 DIAGNOSIS — I10 Essential (primary) hypertension: Secondary | ICD-10-CM | POA: Diagnosis not present

## 2023-04-20 MED ORDER — LISINOPRIL-HYDROCHLOROTHIAZIDE 10-12.5 MG PO TABS: 1.0000 | ORAL_TABLET | Freq: Every day | ORAL | 3 refills | Status: DC

## 2023-04-20 NOTE — Progress Notes (Signed)
Date:  04/20/2023   Name:  Nicole Vargas   DOB:  06-May-1970   MRN:  [id number]   Chief Complaint: Hypertension  Hypertension This is a chronic problem. The problem is controlled. Pertinent negatives include no chest pain, headaches, palpitations, peripheral edema or shortness of breath. Past treatments include ACE inhibitors and diuretics. The current treatment provides significant improvement. There is no history of kidney disease, CAD/MI or CVA.  She has not been on her diet recently and gained back a lot of weight.  She is planning to resume in the near future. Mild perimenopausal symptoms - irritability, hot flashes but otherwise doing well.  Review of Systems  Constitutional:  Positive for unexpected weight change.  Respiratory:  Negative for chest tightness and shortness of breath.   Cardiovascular:  Negative for chest pain and palpitations.  Gastrointestinal:  Negative for abdominal pain.  Genitourinary:  Positive for menstrual problem (missed a few cycles; feeling irritable and having some hot flashes at night).  Neurological:  Negative for dizziness and headaches.  Psychiatric/Behavioral:  Negative for dysphoric mood and sleep disturbance. The patient is not nervous/anxious.      Lab Results  Component Value Date   NA 142 09/04/2022   K 4.4 09/04/2022   CO2 21 09/04/2022   GLUCOSE 90 09/04/2022   BUN 13 09/04/2022   CREATININE 0.62 09/04/2022   CALCIUM 9.8 09/04/2022   EGFR 108 09/04/2022   GFRNONAA 108 09/19/2019   Lab Results  Component Value Date   CHOL 250 (H) 09/29/2021   HDL 55 09/29/2021   LDLCALC 175 (H) 09/29/2021   TRIG 113 09/29/2021   CHOLHDL 4.5 (H) 09/29/2021   Lab Results  Component Value Date   TSH 1.740 09/04/2022   Lab Results  Component Value Date   HGBA1C 5.5 09/29/2021   Lab Results  Component Value Date   WBC 7.1 09/04/2022   HGB 14.6 09/04/2022   HCT 43.6 09/04/2022   MCV 92 09/04/2022   PLT 245 09/04/2022   Lab  Results  Component Value Date   ALT 12 09/04/2022   AST 18 09/04/2022   ALKPHOS 35 (L) 09/04/2022   BILITOT 0.4 09/04/2022   Lab Results  Component Value Date   VD25OH 34.8 10/12/2015     Patient Active Problem List   Diagnosis Date Noted   Claustrophobia 03/09/2021   Motion sickness 03/09/2021   OSA on CPAP 08/04/2020   Hx of basal cell carcinoma 03/03/2020   Sleep disorder 03/03/2020   Perimenopausal symptoms 09/19/2019   Irritable bowel syndrome with diarrhea 01/21/2019   Migraine without aura and without status migrainosus, not intractable 09/12/2017   Arthritis of ankle, right 02/23/2017   S/P breast biopsy, left 10/11/2016   Hyperlipidemia, mixed 08/24/2016   Plantar fasciitis, bilateral 04/14/2016   Iron deficiency 07/05/2015   Vitamin D deficiency 11/24/2014   Prediabetes 11/24/2014   Essential (primary) hypertension 08/06/2014   Obesity, Class III, BMI 40-49.9 (morbid obesity) (HCC) 08/06/2014    Allergies  Allergen Reactions   Bactrim [Sulfamethoxazole-Trimethoprim] Rash   Latex Rash    Past Surgical History:  Procedure Laterality Date   BREAST BIOPSY Left 10/09/2016   Afffirm Biopsy- bx/clip-neg   CESAREAN SECTION  03/04/2000   CHOLECYSTECTOMY  2006   TUBAL LIGATION  2006    Social History   Tobacco Use   Smoking status: Never   Smokeless tobacco: Never  Vaping Use   Vaping status: Never Used  Substance Use Topics  Alcohol use: No    Alcohol/week: 0.0 standard drinks of alcohol   Drug use: No     Medication list has been reviewed and updated.  Current Meds  Medication Sig   Cholecalciferol (VITAMIN D) 2000 units CAPS Take 1 capsule (2,000 Units total) by mouth daily.   ferrous sulfate 325 (65 FE) MG EC tablet Take 1 tablet (325 mg total) by mouth daily with breakfast.   fluticasone (FLONASE) 50 MCG/ACT nasal spray Place 1 spray into both nostrils daily. (Patient taking differently: Place 1 spray into both nostrils as needed.)    Homeopathic Products (AZO YEAST PLUS PO) Take by mouth in the morning, at noon, and at bedtime.   [DISCONTINUED] lisinopril-hydrochlorothiazide (ZESTORETIC) 10-12.5 MG tablet TAKE 1 TABLET BY MOUTH EVERY DAY       04/20/2023    1:31 PM 02/12/2023   11:15 AM 09/04/2022    3:34 PM 04/03/2022    3:38 PM  GAD 7 : Generalized Anxiety Score  Nervous, Anxious, on Edge 0 0 0 0  Control/stop worrying 0 0 0 0  Worry too much - different things 0 0 1 0  Trouble relaxing 0 0 0 0  Restless 0 0 0 1  Easily annoyed or irritable 1 0 2 1  Afraid - awful might happen 0 0 0 0  Total GAD 7 Score 1 0 3 2  Anxiety Difficulty Not difficult at all Not difficult at all Not difficult at all Not difficult at all       04/20/2023    1:30 PM 02/12/2023   11:15 AM 09/04/2022    3:34 PM  Depression screen PHQ 2/9  Decreased Interest 1 0 0  Down, Depressed, Hopeless 1 0 0  PHQ - 2 Score 2 0 0  Altered sleeping 2 0 0  Tired, decreased energy 2 0 2  Change in appetite 3 0 1  Feeling bad or failure about yourself  2 0 0  Trouble concentrating 0 0 0  Moving slowly or fidgety/restless 0 0 0  Suicidal thoughts 0 0 0  PHQ-9 Score 11 0 3  Difficult doing work/chores Not difficult at all Not difficult at all Not difficult at all    BP Readings from Last 3 Encounters:  04/20/23 102/78  11/27/22 128/70  09/04/22 124/72    Physical Exam Vitals and nursing note reviewed.  Constitutional:      General: She is not in acute distress.    Appearance: She is well-developed.  HENT:     Head: Normocephalic and atraumatic.  Cardiovascular:     Rate and Rhythm: Normal rate and regular rhythm.  Pulmonary:     Effort: Pulmonary effort is normal. No respiratory distress.     Breath sounds: No wheezing or rhonchi.  Musculoskeletal:     Right lower leg: No edema.     Left lower leg: No edema.  Lymphadenopathy:     Cervical: No cervical adenopathy.  Skin:    General: Skin is warm and dry.     Capillary Refill:  Capillary refill takes less than 2 seconds.     Findings: No rash.  Neurological:     General: No focal deficit present.     Mental Status: She is alert and oriented to person, place, and time.  Psychiatric:        Mood and Affect: Mood normal.        Behavior: Behavior normal.     Wt Readings from Last 3 Encounters:  04/20/23  208 lb (94.3 kg)  02/12/23 190 lb (86.2 kg)  11/27/22 190 lb (86.2 kg)    BP 102/78 (BP Location: Right Arm, Patient Position: Sitting, Cuff Size: Large)   Pulse (!) 105   Ht 4\' 11"  (1.499 m)   Wt 208 lb (94.3 kg)   SpO2 97%   BMI 42.01 kg/m   Assessment and Plan:  Problem List Items Addressed This Visit       Unprioritized   Essential (primary) hypertension - Primary (Chronic)   Controlled BP with normal exam. Current regimen is lisinopril hct. Will continue same medications; encourage continued reduced sodium diet.       Relevant Medications   lisinopril-hydrochlorothiazide (ZESTORETIC) 10-12.5 MG tablet   Perimenopausal symptoms   Mild irritability recently Hot flashes are tolerable Last cycles October and June.       Return in about 6 months (around 10/19/2023) for CPX.    Reubin Milan, MD [facility] [place]

## 2023-04-20 NOTE — Assessment & Plan Note (Signed)
Mild irritability recently Hot flashes are tolerable Last cycles October and June.

## 2023-04-20 NOTE — Assessment & Plan Note (Signed)
 Controlled BP with normal exam. Current regimen is lisinopril hct. Will continue same medications; encourage continued reduced sodium diet.

## 2023-05-25 ENCOUNTER — Telehealth: Payer: Self-pay | Admitting: Internal Medicine

## 2023-05-25 NOTE — Telephone Encounter (Signed)
Pt is calling to report that she is needed an updated work note stating that she needs frequent rest room breaks. If patient is in need of an appt. Please advise by way of Mychart.

## 2023-07-25 ENCOUNTER — Ambulatory Visit
Admission: EM | Admit: 2023-07-25 | Discharge: 2023-07-25 | Disposition: A | Discharge status: Home / Self Care | Attending: Physician Assistant | Admitting: Physician Assistant

## 2023-07-25 DIAGNOSIS — J101 Influenza due to other identified influenza virus with other respiratory manifestations: Secondary | ICD-10-CM | POA: Diagnosis present

## 2023-07-25 DIAGNOSIS — J069 Acute upper respiratory infection, unspecified: Secondary | ICD-10-CM | POA: Diagnosis present

## 2023-07-25 LAB — RESP PANEL BY RT-PCR (FLU A&B, COVID) ARPGX2
Influenza A by PCR: POSITIVE — AB
Influenza B by PCR: NEGATIVE
SARS Coronavirus 2 by RT PCR: NEGATIVE

## 2023-07-25 MED ORDER — IPRATROPIUM BROMIDE 0.06 % NA SOLN: 2.0000 | Freq: Four times a day (QID) | NASAL | 0 refills | Status: AC

## 2023-07-25 MED ORDER — PROMETHAZINE-DM 6.25-15 MG/5ML PO SYRP: 5.0000 mL | ORAL_SOLUTION | Freq: Four times a day (QID) | ORAL | 0 refills | Status: DC | PRN

## 2023-07-25 NOTE — Discharge Instructions (Signed)
-  Will call with COVID or flu positive.  If positive for either disease you should isolate until you are fever free 24 hours and feeling better.  URI/COLD SYMPTOMS: Your exam today is consistent with a viral illness. Antibiotics are not indicated at this time. Use medications as directed, including cough syrup, nasal saline, and decongestants. Your symptoms should improve over the next few days and resolve within 7-10 days. Increase rest and fluids. F/u if symptoms worsen or predominate such as sore throat, ear pain, productive cough, shortness of breath, or if you develop high fevers or worsening fatigue over the next several days.

## 2023-07-25 NOTE — ED Provider Notes (Signed)
 [facility]    CSN: [id number] Arrival date & time: 07/25/23  1540      History   Chief Complaint Chief Complaint  Patient presents with   Cough    HPI Nicole Vargas is a 53 y.o. female presenting for 3-day history of fatigue, cough, congestion, bilateral ear pressure, scratchy throat and headaches.  Denies fever, chills, sweats, sinus pain, chest pain, shortness of breath, abdominal pain, vomiting or diarrhea.  Has been taking Coricidin initially but switched to Robitussin which helped.  Took a COVID test 2 days ago and it was negative.  Unsure of any sick contacts.  Works at [employer].  No history of asthma, COPD or tobacco abuse.  HPI  Past Medical History:  Diagnosis Date   Abscess    Hx of basal cell carcinoma 03/03/2020   Hypertension    Migraine     Patient Active Problem List   Diagnosis Date Noted   Claustrophobia 03/09/2021   Motion sickness 03/09/2021   OSA on CPAP 08/04/2020   Hx of basal cell carcinoma 03/03/2020   Sleep disorder 03/03/2020   Perimenopausal symptoms 09/19/2019   Irritable bowel syndrome with diarrhea 01/21/2019   Migraine without aura and without status migrainosus, not intractable 09/12/2017   Arthritis of ankle, right 02/23/2017   S/P breast biopsy, left 10/11/2016   Hyperlipidemia, mixed 08/24/2016   Plantar fasciitis, bilateral 04/14/2016   Iron deficiency 07/05/2015   Vitamin D deficiency 11/24/2014   Prediabetes 11/24/2014   Essential (primary) hypertension 08/06/2014   Obesity, Class III, BMI 40-49.9 (morbid obesity) (HCC) 08/06/2014    Past Surgical History:  Procedure Laterality Date   BREAST BIOPSY Left 10/09/2016   Afffirm Biopsy- bx/clip-neg   CESAREAN SECTION  03/04/2000   CHOLECYSTECTOMY  2006   TUBAL LIGATION  2006    OB History   No obstetric history on file.      Home Medications    Prior to Admission medications   Medication Sig Start Date End Date Taking? Authorizing Provider   ipratropium (ATROVENT) 0.06 % nasal spray Place 2 sprays into both nostrils 4 (four) times daily. 07/25/23  Yes Shirlee Latch, PA-C  promethazine-dextromethorphan (PROMETHAZINE-DM) 6.25-15 MG/5ML syrup Take 5 mLs by mouth 4 (four) times daily as needed. 07/25/23  Yes Shirlee Latch, PA-C  Cholecalciferol (VITAMIN D) 2000 units CAPS Take 1 capsule (2,000 Units total) by mouth daily. 07/05/15   Plonk, Chrissie Noa, MD  ferrous sulfate 325 (65 FE) MG EC tablet Take 1 tablet (325 mg total) by mouth daily with breakfast. 07/05/15   Plonk, Chrissie Noa, MD  fluticasone (FLONASE) 50 MCG/ACT nasal spray Place 1 spray into both nostrils daily. Patient taking differently: Place 1 spray into both nostrils as needed. 02/27/21   LampteyBritta Mccreedy, MD  Homeopathic Products (AZO YEAST PLUS PO) Take by mouth in the morning, at noon, and at bedtime.    [provider]  lisinopril-hydrochlorothiazide (ZESTORETIC) 10-12.5 MG tablet Take 1 tablet by mouth daily. 04/20/23   Reubin Milan, MD    Family History Family History  Problem Relation Age of Onset   Macular degeneration Mother    Stroke Father    Diabetes Maternal Grandmother    Heart disease Paternal Grandfather    Breast cancer Maternal Aunt 12       aunt and great aunt    Social History Social History   Tobacco Use   Smoking status: Never   Smokeless tobacco: Never  Vaping Use   Vaping  status: Never Used  Substance Use Topics   Alcohol use: No    Alcohol/week: 0.0 standard drinks of alcohol   Drug use: No     Allergies   Bactrim [sulfamethoxazole-trimethoprim] and Latex   Review of Systems Review of Systems  Constitutional:  Positive for fatigue. Negative for chills, diaphoresis and fever.  HENT:  Positive for congestion, rhinorrhea and sore throat. Negative for ear pain, sinus pressure and sinus pain.   Respiratory:  Positive for cough. Negative for shortness of breath.   Cardiovascular:  Negative for chest pain.   Gastrointestinal:  Negative for abdominal pain, nausea and vomiting.  Musculoskeletal:  Negative for arthralgias and myalgias.  Skin:  Negative for rash.  Neurological:  Positive for headaches. Negative for weakness.  Hematological:  Negative for adenopathy.     Physical Exam Triage Vital Signs ED Triage Vitals  Encounter Vitals Group     BP      Systolic BP Percentile      Diastolic BP Percentile      Pulse      Resp      Temp      Temp src      SpO2      Weight      Height      Head Circumference      Peak Flow      Pain Score      Pain Loc      Pain Education      Exclude from Growth Chart    No data found.  Updated Vital Signs BP 115/72 (BP Location: Right Arm)   Pulse 88   Temp 98.6 F (37 C) (Oral)   Resp 18   LMP 07/12/2023   SpO2 96%      Physical Exam Vitals and nursing note reviewed.  Constitutional:      General: She is not in acute distress.    Appearance: Normal appearance. She is not ill-appearing or toxic-appearing.  HENT:     Head: Normocephalic and atraumatic.     Right Ear: A middle ear effusion is present.     Left Ear: A middle ear effusion is present.     Nose: Congestion present.     Mouth/Throat:     Mouth: Mucous membranes are moist.     Pharynx: Oropharynx is clear. Posterior oropharyngeal erythema present.  Eyes:     General: No scleral icterus.       Right eye: No discharge.        Left eye: No discharge.     Conjunctiva/sclera: Conjunctivae normal.  Cardiovascular:     Rate and Rhythm: Normal rate and regular rhythm.     Heart sounds: Normal heart sounds.  Pulmonary:     Effort: Pulmonary effort is normal. No respiratory distress.     Breath sounds: Normal breath sounds.  Musculoskeletal:     Cervical back: Neck supple.  Skin:    General: Skin is dry.  Neurological:     General: No focal deficit present.     Mental Status: She is alert. Mental status is at baseline.     Motor: No weakness.     Gait: Gait normal.   Psychiatric:        Mood and Affect: Mood normal.        Behavior: Behavior normal.      UC Treatments / Results  Labs (all labs ordered are listed, but only abnormal results are displayed) Labs Reviewed  RESP PANEL BY  RT-PCR (FLU A&B, COVID) ARPGX2 - Abnormal; Notable for the following components:      Result Value   Influenza A by PCR POSITIVE (*)    All other components within normal limits    EKG   Radiology No results found.  Procedures Procedures (including critical care time)  Medications Ordered in UC Medications - No data to display  Initial Impression / Assessment and Plan / UC Course  I have reviewed the triage vital signs and the nursing notes.  Pertinent labs & imaging results that were available during my care of the patient were reviewed by me and considered in my medical decision making (see chart for details).   53 year old female presents for 3-day history of fatigue, cough, congestion, sore/scratchy throat, voice hoarseness, bilateral ear pressure.  Denies fever.  Vitals normal and stable.  Overall well-appearing.  No acute distress.  On exam has effusion of bilateral TMs with bulging of right TM, nasal congestion, erythema mildly of posterior pharynx with postnasal drainage.  Chest clear.  Heart regular rate and rhythm.  Respiratory panel obtained.  Positive influenza A.  Result communicated to patient.  Reviewed current CDC guidelines, isolation protocol and any precautions.  Sent Promethazine DM and Atrovent nasal spray to pharmacy.  Reviewed return precautions and ED precautions.   Final Clinical Impressions(s) / UC Diagnoses   Final diagnoses:  Viral URI with cough  Influenza A     Discharge Instructions      -Will call with COVID or flu positive.  If positive for either disease you should isolate until you are fever free 24 hours and feeling better.  URI/COLD SYMPTOMS: Your exam today is consistent with a viral illness. Antibiotics  are not indicated at this time. Use medications as directed, including cough syrup, nasal saline, and decongestants. Your symptoms should improve over the next few days and resolve within 7-10 days. Increase rest and fluids. F/u if symptoms worsen or predominate such as sore throat, ear pain, productive cough, shortness of breath, or if you develop high fevers or worsening fatigue over the next several days.       ED Prescriptions     Medication Sig Dispense Auth. Provider   promethazine-dextromethorphan (PROMETHAZINE-DM) 6.25-15 MG/5ML syrup Take 5 mLs by mouth 4 (four) times daily as needed. 118 mL Eusebio Friendly B, PA-C   ipratropium (ATROVENT) 0.06 % nasal spray Place 2 sprays into both nostrils 4 (four) times daily. 15 mL Shirlee Latch, PA-C      PDMP not reviewed this encounter.   Shirlee Latch, PA-C 07/25/23 1729

## 2023-07-25 NOTE — ED Triage Notes (Signed)
 Pt reports she has a cough, headache, and bilateral ear pain x 3 days   Took coricidin HBP Took home covid test / negative result

## 2023-09-28 ENCOUNTER — Ambulatory Visit
Admission: EM | Admit: 2023-09-28 | Discharge: 2023-09-28 | Disposition: A | Discharge status: Home / Self Care | Attending: Physician Assistant | Admitting: Physician Assistant

## 2023-09-28 ENCOUNTER — Ambulatory Visit: Payer: Self-pay | Admitting: Physician Assistant

## 2023-09-28 ENCOUNTER — Ambulatory Visit: Payer: Self-pay

## 2023-09-28 ENCOUNTER — Encounter: Payer: Self-pay | Admitting: Emergency Medicine

## 2023-09-28 DIAGNOSIS — R051 Acute cough: Secondary | ICD-10-CM

## 2023-09-28 DIAGNOSIS — J069 Acute upper respiratory infection, unspecified: Secondary | ICD-10-CM | POA: Diagnosis present

## 2023-09-28 DIAGNOSIS — R0981 Nasal congestion: Secondary | ICD-10-CM | POA: Diagnosis present

## 2023-09-28 LAB — RESP PANEL BY RT-PCR (FLU A&B, COVID) ARPGX2
Influenza A by PCR: NEGATIVE
Influenza B by PCR: NEGATIVE
SARS Coronavirus 2 by RT PCR: NEGATIVE

## 2023-09-28 NOTE — ED Provider Notes (Signed)
 [facility]    CSN: [id number] Arrival date & time: 09/28/23  1634      History   Chief Complaint Chief Complaint  Patient presents with   Cough   Nasal Congestion    HPI Nicole Vargas is a 53 y.o. female presenting for 2-day history of fatigue, cough, congestion, runny nose, and headaches.  Denies fever, chills, sweats, sore throat, sinus pain, chest pain, shortness of breath, abdominal pain, vomiting or diarrhea.  Has been taking Robitussin which helps.  Would like a flu test. Positive for flu A 2 months ago and says she feels nearly as bad as that now.  Unsure of any sick contacts.  Works at [employer].  No history of asthma, COPD or tobacco abuse.  HPI  Past Medical History:  Diagnosis Date   Abscess    Hx of basal cell carcinoma 03/03/2020   Hypertension    Migraine     Patient Active Problem List   Diagnosis Date Noted   Claustrophobia 03/09/2021   Motion sickness 03/09/2021   OSA on CPAP 08/04/2020   Hx of basal cell carcinoma 03/03/2020   Sleep disorder 03/03/2020   Perimenopausal symptoms 09/19/2019   Irritable bowel syndrome with diarrhea 01/21/2019   Migraine without aura and without status migrainosus, not intractable 09/12/2017   Arthritis of ankle, right 02/23/2017   S/P breast biopsy, left 10/11/2016   Hyperlipidemia, mixed 08/24/2016   Plantar fasciitis, bilateral 04/14/2016   Iron deficiency 07/05/2015   Vitamin D  deficiency 11/24/2014   Prediabetes 11/24/2014   Essential (primary) hypertension 08/06/2014   Obesity, Class III, BMI 40-49.9 (morbid obesity) (HCC) 08/06/2014    Past Surgical History:  Procedure Laterality Date   BREAST BIOPSY Left 10/09/2016   Afffirm Biopsy- bx/clip-neg   CESAREAN SECTION  03/04/2000   CHOLECYSTECTOMY  2006   TUBAL LIGATION  2006    OB History   No obstetric history on file.      Home Medications    Prior to Admission medications   Medication Sig Start Date End Date Taking?  Authorizing Provider  Cholecalciferol (VITAMIN D ) 2000 units CAPS Take 1 capsule (2,000 Units total) by mouth daily. 07/05/15  Yes Plonk, Sammie Crigler, MD  ferrous sulfate  325 (65 FE) MG EC tablet Take 1 tablet (325 mg total) by mouth daily with breakfast. 07/05/15  Yes Plonk, Sammie Crigler, MD  lisinopril -hydrochlorothiazide  (ZESTORETIC ) 10-12.5 MG tablet Take 1 tablet by mouth daily. 04/20/23  Yes Sheron Dixons, MD  fluticasone  (FLONASE ) 50 MCG/ACT nasal spray Place 1 spray into both nostrils daily. Patient taking differently: Place 1 spray into both nostrils as needed. 02/27/21   LampteyDonley Furth, MD  Homeopathic Products (AZO YEAST PLUS PO) Take by mouth in the morning, at noon, and at bedtime.    [provider]  ipratropium (ATROVENT ) 0.06 % nasal spray Place 2 sprays into both nostrils 4 (four) times daily. 07/25/23   Nancy Axon B, PA-C  promethazine -dextromethorphan (PROMETHAZINE -DM) 6.25-15 MG/5ML syrup Take 5 mLs by mouth 4 (four) times daily as needed. 07/25/23   Floydene Hy, PA-C    Family History Family History  Problem Relation Age of Onset   Macular degeneration Mother    Stroke Father    Diabetes Maternal Grandmother    Heart disease Paternal Grandfather    Breast cancer Maternal Aunt 87       aunt and great aunt    Social History Social History   Tobacco Use   Smoking status: Never  Smokeless tobacco: Never  Vaping Use   Vaping status: Never Used  Substance Use Topics   Alcohol use: No    Alcohol/week: 0.0 standard drinks of alcohol   Drug use: No     Allergies   Bactrim  [sulfamethoxazole -trimethoprim ] and Latex   Review of Systems Review of Systems  Constitutional:  Positive for fatigue. Negative for chills, diaphoresis and fever.  HENT:  Positive for congestion and rhinorrhea. Negative for ear pain, sinus pressure, sinus pain and sore throat.   Respiratory:  Positive for cough. Negative for shortness of breath.   Cardiovascular:  Negative for chest  pain.  Gastrointestinal:  Negative for abdominal pain, nausea and vomiting.  Musculoskeletal:  Negative for arthralgias and myalgias.  Skin:  Negative for rash.  Neurological:  Positive for headaches. Negative for weakness.  Hematological:  Negative for adenopathy.     Physical Exam Triage Vital Signs ED Triage Vitals  Encounter Vitals Group     BP      Systolic BP Percentile      Diastolic BP Percentile      Pulse      Resp      Temp      Temp src      SpO2      Weight      Height      Head Circumference      Peak Flow      Pain Score      Pain Loc      Pain Education      Exclude from Growth Chart    No data found.  Updated Vital Signs BP 107/74 (BP Location: Left Arm)   Pulse 85   Temp 98.4 F (36.9 C) (Oral)   Resp 16   Ht 4\' 11"  (1.499 m)   Wt 207 lb 14.3 oz (94.3 kg)   SpO2 100%   BMI 41.99 kg/m      Physical Exam Vitals and nursing note reviewed.  Constitutional:      General: She is not in acute distress.    Appearance: Normal appearance. She is not ill-appearing or toxic-appearing.  HENT:     Head: Normocephalic and atraumatic.     Nose: Congestion present.     Mouth/Throat:     Mouth: Mucous membranes are moist.     Pharynx: Oropharynx is clear. No posterior oropharyngeal erythema.  Eyes:     General: No scleral icterus.       Right eye: No discharge.        Left eye: No discharge.     Conjunctiva/sclera: Conjunctivae normal.  Cardiovascular:     Rate and Rhythm: Normal rate and regular rhythm.     Heart sounds: Normal heart sounds.  Pulmonary:     Effort: Pulmonary effort is normal. No respiratory distress.     Breath sounds: Normal breath sounds.  Musculoskeletal:     Cervical back: Neck supple.  Skin:    General: Skin is dry.  Neurological:     General: No focal deficit present.     Mental Status: She is alert. Mental status is at baseline.     Motor: No weakness.     Gait: Gait normal.  Psychiatric:        Mood and Affect:  Mood normal.        Behavior: Behavior normal.      UC Treatments / Results  Labs (all labs ordered are listed, but only abnormal results are displayed) Labs Reviewed  RESP  PANEL BY RT-PCR (FLU A&B, COVID) [id number]    EKG   Radiology No results found.  Procedures Procedures (including critical care time)  Medications Ordered in UC Medications - No data to display  Initial Impression / Assessment and Plan / UC Course  I have reviewed the triage vital signs and the nursing notes.  Pertinent labs & imaging results that were available during my care of the patient were reviewed by me and considered in my medical decision making (see chart for details).   53 year old female presents for 2-day history of fatigue, cough, congestion, and runny nose. Denies fever.  Vitals normal and stable.  Overall well-appearing.  No acute distress.  On exam has nasal congestion, no erythema  of posterior pharynx. + postnasal drainage.  Chest clear.  Heart regular rate and rhythm.  Respiratory panel obtained.  Negative. Result communicated to patient.  Viral URI. Discussed use of OTC meds. Reviewed return precautions and ED precautions.   Final Clinical Impressions(s) / UC Diagnoses   Final diagnoses:  Viral upper respiratory tract infection  Acute cough  Nasal congestion     Discharge Instructions      URI/COLD SYMPTOMS: Your exam today is consistent with a viral illness. Antibiotics are not indicated at this time. Use medications as directed, including cough syrup, nasal saline, and decongestants. Your symptoms should improve over the next few days and resolve within 7-10 days. Increase rest and fluids. F/u if symptoms worsen or predominate such as sore throat, ear pain, productive cough, shortness of breath, or if you develop high fevers or worsening fatigue over the next several days.      ED Prescriptions   None    PDMP not reviewed this encounter.       Floydene Hy,  PA-C 09/28/23 1745

## 2023-09-28 NOTE — Telephone Encounter (Signed)
 Please review message

## 2023-09-28 NOTE — ED Triage Notes (Signed)
 Pt c/o cough, headache, sneezing, nasal congestion, sinus pain/pressure. Started about 2 days ago. Denies fever.

## 2023-09-28 NOTE — Discharge Instructions (Signed)

## 2023-09-28 NOTE — Telephone Encounter (Signed)
 FYI Only or Action Required?: FYI only for provider  Patient was last seen in primary care on 04/20/2023 by Sheron Dixons, MD. Called Nurse Triage reporting Cough. Symptoms began 2 days ago. Interventions attempted: OTC medications: robitussin and cough drops. Symptoms are: gradually improving.  Triage Disposition: Home Care  Patient/caregiver understands and will follow disposition?: Yes    Copied From CRM [id number]. Reason for Triage: patient has cough, congestion, headache, patient would like to be seen today.  Reason for Disposition  Cough  Answer Assessment - Initial Assessment Questions 1. ONSET: "When did the cough begin?"      X 2 days  2. SEVERITY: "How bad is the cough today?"      Mild 3. SPUTUM: "Describe the color of your sputum" (none, dry cough; clear, white, yellow, green)     Doesn't know 4. HEMOPTYSIS: "Are you coughing up any blood?" If so ask: "How much?" (flecks, streaks, tablespoons, etc.)     no 5. DIFFICULTY BREATHING: "Are you having difficulty breathing?" If Yes, ask: "How bad is it?" (e.g., mild, moderate, severe)    - MILD: No SOB at rest, mild SOB with walking, speaks normally in sentences, can lie down, no retractions, pulse < 100.    - MODERATE: SOB at rest, SOB with minimal exertion and prefers to sit, cannot lie down flat, speaks in phrases, mild retractions, audible wheezing, pulse 100-120.    - SEVERE: Very SOB at rest, speaks in single words, struggling to breathe, sitting hunched forward, retractions, pulse > 120      no 6. FEVER: "Do you have a fever?" If Yes, ask: "What is your temperature, how was it measured, and when did it start?"     no 7. CARDIAC HISTORY: "Do you have any history of heart disease?" (e.g., heart attack, congestive heart failure)      no 8. LUNG HISTORY: "Do you have any history of lung disease?"  (e.g., pulmonary embolus, asthma, emphysema)     no 9. PE RISK FACTORS: "Do you have a history of blood clots?" (or: recent  major surgery, recent prolonged travel, bedridden)     no 10. OTHER SYMPTOMS: "Do you have any other symptoms?" (e.g., runny nose, wheezing, chest pain)       Nasal Congestion, sinus headache, sneezing  Protocols used: Cough - Acute Non-Productive-A-AH

## 2023-10-19 ENCOUNTER — Encounter: Payer: Self-pay | Admitting: Internal Medicine

## 2023-10-19 ENCOUNTER — Ambulatory Visit (INDEPENDENT_AMBULATORY_CARE_PROVIDER_SITE_OTHER): Payer: Self-pay | Admitting: Internal Medicine

## 2023-10-19 VITALS — BP 118/72 | HR 75 | Ht 59.0 in | Wt 206.0 lb

## 2023-10-19 DIAGNOSIS — R7303 Prediabetes: Secondary | ICD-10-CM

## 2023-10-19 DIAGNOSIS — Z1231 Encounter for screening mammogram for malignant neoplasm of breast: Secondary | ICD-10-CM

## 2023-10-19 DIAGNOSIS — I10 Essential (primary) hypertension: Secondary | ICD-10-CM

## 2023-10-19 DIAGNOSIS — G4733 Obstructive sleep apnea (adult) (pediatric): Secondary | ICD-10-CM | POA: Diagnosis not present

## 2023-10-19 DIAGNOSIS — E782 Mixed hyperlipidemia: Secondary | ICD-10-CM

## 2023-10-19 DIAGNOSIS — Z0000 Encounter for general adult medical examination without abnormal findings: Secondary | ICD-10-CM

## 2023-10-19 NOTE — Assessment & Plan Note (Signed)
 Low 10 yr risk of CAD Lab Results  Component Value Date   LDLCALC 175 (H) 09/29/2021  Will check labs and advise. Continue dietary efforts

## 2023-10-19 NOTE — Progress Notes (Signed)
 Date:  10/19/2023   Name:  Nicole Vargas   DOB:  1970/10/24   MRN:  [id number]   Chief Complaint: Annual Exam Nicole Vargas is a 53 y.o. female who presents today for her Complete Annual Exam. She feels well. She reports exercising - none. She reports she is sleeping well. Breast complaints - none.  Health Maintenance  Topic Date Due   HIV Screening  Never done   Hepatitis B Vaccine (1 of 3 - 19+ 3-dose series) Never done   COVID-19 Vaccine (3 - Pfizer risk series) 09/16/2019   Zoster (Shingles) Vaccine (1 of 2) 01/19/2024*   DTaP/Tdap/Td vaccine (1 - Tdap) 10/18/2024*   Mammogram  11/14/2023   Flu Shot  11/23/2023   Pap with HPV screening  07/13/2026   Colon Cancer Screening  04/28/2032   Hepatitis C Screening  Completed   HPV Vaccine  Aged Out   Meningitis B Vaccine  Aged Out  *Topic was postponed. The date shown is not the original due date.    Hypertension This is a chronic problem. The problem is controlled. Pertinent negatives include no chest pain, headaches, palpitations or shortness of breath. Past treatments include ACE inhibitors.    Review of Systems  Constitutional:  Negative for fatigue and unexpected weight change.  HENT:  Negative for trouble swallowing.   Eyes:  Negative for visual disturbance.  Respiratory:  Negative for cough, chest tightness, shortness of breath and wheezing.   Cardiovascular:  Negative for chest pain, palpitations and leg swelling.  Gastrointestinal:  Negative for abdominal pain, constipation and diarrhea.  Genitourinary:  Negative for dysuria, frequency and urgency.  Musculoskeletal:  Negative for arthralgias and myalgias.  Neurological:  Positive for dizziness. Negative for weakness, light-headedness and headaches.  Psychiatric/Behavioral:  Negative for dysphoric mood and sleep disturbance. The patient is not nervous/anxious.      Lab Results  Component Value Date   NA 142 09/04/2022   K 4.4 09/04/2022    CO2 21 09/04/2022   GLUCOSE 90 09/04/2022   BUN 13 09/04/2022   CREATININE 0.62 09/04/2022   CALCIUM 9.8 09/04/2022   EGFR 108 09/04/2022   GFRNONAA 108 09/19/2019   Lab Results  Component Value Date   CHOL 250 (H) 09/29/2021   HDL 55 09/29/2021   LDLCALC 175 (H) 09/29/2021   TRIG 113 09/29/2021   CHOLHDL 4.5 (H) 09/29/2021   Lab Results  Component Value Date   TSH 1.740 09/04/2022   Lab Results  Component Value Date   HGBA1C 5.5 09/29/2021   Lab Results  Component Value Date   WBC 7.1 09/04/2022   HGB 14.6 09/04/2022   HCT 43.6 09/04/2022   MCV 92 09/04/2022   PLT 245 09/04/2022   Lab Results  Component Value Date   ALT 12 09/04/2022   AST 18 09/04/2022   ALKPHOS 35 (L) 09/04/2022   BILITOT 0.4 09/04/2022   Lab Results  Component Value Date   VD25OH 34.8 10/12/2015     Patient Active Problem List   Diagnosis Date Noted   Claustrophobia 03/09/2021   Motion sickness 03/09/2021   OSA on CPAP 08/04/2020   Hx of basal cell carcinoma 03/03/2020   Sleep disorder 03/03/2020   Perimenopausal symptoms 09/19/2019   Irritable bowel syndrome with diarrhea 01/21/2019   Migraine without aura and without status migrainosus, not intractable 09/12/2017   Arthritis of ankle, right 02/23/2017   S/P breast biopsy, left 10/11/2016   Hyperlipidemia, mixed 08/24/2016   Vitamin  D deficiency 11/24/2014   Prediabetes 11/24/2014   Essential (primary) hypertension 08/06/2014   Obesity, Class III, BMI 40-49.9 (morbid obesity) (HCC) 08/06/2014    Allergies  Allergen Reactions   Bactrim  [Sulfamethoxazole -Trimethoprim ] Rash   Latex Rash    Past Surgical History:  Procedure Laterality Date   BREAST BIOPSY Left 10/09/2016   Afffirm Biopsy- bx/clip-neg   CESAREAN SECTION  03/04/2000   CHOLECYSTECTOMY  2006   TUBAL LIGATION  2006    Social History   Tobacco Use   Smoking status: Never   Smokeless tobacco: Never  Vaping Use   Vaping status: Never Used  Substance Use  Topics   Alcohol use: No    Alcohol/week: 0.0 standard drinks of alcohol   Drug use: No     Medication list has been reviewed and updated.  Current Meds  Medication Sig   Cholecalciferol (VITAMIN D ) 2000 units CAPS Take 1 capsule (2,000 Units total) by mouth daily.   ferrous sulfate  325 (65 FE) MG EC tablet Take 1 tablet (325 mg total) by mouth daily with breakfast.   fluticasone  (FLONASE ) 50 MCG/ACT nasal spray Place 1 spray into both nostrils daily. (Patient taking differently: Place 1 spray into both nostrils as needed.)   Homeopathic Products (AZO YEAST PLUS PO) Take by mouth in the morning, at noon, and at bedtime.   ipratropium (ATROVENT ) 0.06 % nasal spray Place 2 sprays into both nostrils 4 (four) times daily.   lisinopril -hydrochlorothiazide  (ZESTORETIC ) 10-12.5 MG tablet Take 1 tablet by mouth daily.       10/19/2023    8:02 AM 04/20/2023    1:31 PM 02/12/2023   11:15 AM 09/04/2022    3:34 PM  GAD 7 : Generalized Anxiety Score  Nervous, Anxious, on Edge 0 0 0 0  Control/stop worrying 0 0 0 0  Worry too much - different things 1 0 0 1  Trouble relaxing 0 0 0 0  Restless 0 0 0 0  Easily annoyed or irritable 0 1 0 2  Afraid - awful might happen 0 0 0 0  Total GAD 7 Score 1 1 0 3  Anxiety Difficulty Not difficult at all Not difficult at all Not difficult at all Not difficult at all       10/19/2023    8:02 AM 04/20/2023    1:30 PM 02/12/2023   11:15 AM  Depression screen PHQ 2/9  Decreased Interest 0 1 0  Down, Depressed, Hopeless 0 1 0  PHQ - 2 Score 0 2 0  Altered sleeping 0 2 0  Tired, decreased energy 2 2 0  Change in appetite 1 3 0  Feeling bad or failure about yourself  0 2 0  Trouble concentrating 0 0 0  Moving slowly or fidgety/restless 0 0 0  Suicidal thoughts 0 0 0  PHQ-9 Score 3 11 0  Difficult doing work/chores Not difficult at all Not difficult at all Not difficult at all    BP Readings from Last 3 Encounters:  10/19/23 118/72  09/28/23  107/74  07/25/23 115/72    Physical Exam Vitals and nursing note reviewed.  Constitutional:      General: She is not in acute distress.    Appearance: She is well-developed.  HENT:     Head: Normocephalic and atraumatic.     Right Ear: Tympanic membrane and ear canal normal.     Left Ear: Tympanic membrane and ear canal normal.     Nose:     Right  Sinus: No maxillary sinus tenderness.     Left Sinus: No maxillary sinus tenderness.   Eyes:     General: No scleral icterus.       Right eye: No discharge.        Left eye: No discharge.     Conjunctiva/sclera: Conjunctivae normal.   Neck:     Thyroid: No thyromegaly.     Vascular: No carotid bruit.   Cardiovascular:     Rate and Rhythm: Normal rate and regular rhythm.     Pulses: Normal pulses.     Heart sounds: Normal heart sounds.  Pulmonary:     Effort: Pulmonary effort is normal. No respiratory distress.     Breath sounds: No wheezing.  Abdominal:     General: Bowel sounds are normal.     Palpations: Abdomen is soft.     Tenderness: There is no abdominal tenderness.   Musculoskeletal:     Cervical back: Normal range of motion. No erythema.     Right lower leg: No edema.     Left lower leg: No edema.  Lymphadenopathy:     Cervical: No cervical adenopathy.   Skin:    General: Skin is warm and dry.     Capillary Refill: Capillary refill takes less than 2 seconds.     Findings: No rash.   Neurological:     General: No focal deficit present.     Mental Status: She is alert and oriented to person, place, and time.     Cranial Nerves: No cranial nerve deficit.     Sensory: No sensory deficit.     Deep Tendon Reflexes: Reflexes are normal and symmetric.   Psychiatric:        Attention and Perception: Attention normal.        Mood and Affect: Mood normal.     Wt Readings from Last 3 Encounters:  10/19/23 206 lb (93.4 kg)  09/28/23 207 lb 14.3 oz (94.3 kg)  04/20/23 208 lb (94.3 kg)    BP 118/72   Pulse 75    Ht 4' 11 (1.499 m)   Wt 206 lb (93.4 kg)   SpO2 97%   BMI 41.61 kg/m   Assessment and Plan:  Problem List Items Addressed This Visit       Unprioritized   Essential (primary) hypertension (Chronic)   Blood pressure is well controlled.  Current medications are lisinopril  and hydrochlorothiazide  Will continue same regimen along with efforts to limit dietary sodium.       Relevant Orders   CBC with Differential/Platelet   Comprehensive metabolic panel with GFR   TSH   Urinalysis, Routine w reflex microscopic   Prediabetes (Chronic)   Lab Results  Component Value Date   HGBA1C 5.5 09/29/2021  Has gained some weight recently and may be worsening. Reinforced diet changes and weight management.      Relevant Orders   Hemoglobin A1c   Hyperlipidemia, mixed (Chronic)   Low 10 yr risk of CAD Lab Results  Component Value Date   LDLCALC 175 (H) 09/29/2021  Will check labs and advise. Continue dietary efforts      Relevant Orders   Lipid panel   OSA on CPAP (Chronic)   Benefiting from nightly use. Still having daytime fatigue.      Other Visit Diagnoses       Annual physical exam    -  Primary   up to date on screenings Letter for work given mz:[id number] and [employer]AB  Relevant Orders   CBC with Differential/Platelet   Comprehensive metabolic panel with GFR   Hemoglobin A1c   Lipid panel   TSH   Urinalysis, Routine w reflex microscopic     Encounter for screening mammogram for breast cancer       Relevant Orders   MM 3D SCREENING MAMMOGRAM BILATERAL BREAST       Return in about 6 months (around 04/19/2024) for HTN.    Leita HILARIO Adie, MD [facility] [place]

## 2023-10-19 NOTE — Assessment & Plan Note (Signed)
 Blood pressure is well controlled.  Current medications are lisinopril  and hydrochlorothiazide . Will continue same regimen along with efforts to limit dietary sodium.

## 2023-10-19 NOTE — Patient Instructions (Signed)
 Call [facility] to schedule your mammogram at [phone].

## 2023-10-19 NOTE — Assessment & Plan Note (Signed)
 Lab Results  Component Value Date   HGBA1C 5.5 09/29/2021  Has gained some weight recently and may be worsening. Reinforced diet changes and weight management.

## 2023-10-19 NOTE — Assessment & Plan Note (Signed)
 Benefiting from nightly use. Still having daytime fatigue.

## 2023-10-20 LAB — COMPREHENSIVE METABOLIC PANEL WITH GFR
ALT: 25 IU/L (ref 0–32)
AST: 29 IU/L (ref 0–40)
Albumin: 4.3 g/dL (ref 3.8–4.9)
Alkaline Phosphatase: 35 IU/L — ABNORMAL LOW (ref 44–121)
BUN/Creatinine Ratio: 14 (ref 9–23)
BUN: 10 mg/dL (ref 6–24)
Bilirubin Total: 0.5 mg/dL (ref 0.0–1.2)
CO2: 19 mmol/L — ABNORMAL LOW (ref 20–29)
Calcium: 9.3 mg/dL (ref 8.7–10.2)
Chloride: 105 mmol/L (ref 96–106)
Creatinine, Ser: 0.72 mg/dL (ref 0.57–1.00)
Globulin, Total: 2.5 g/dL (ref 1.5–4.5)
Glucose: 100 mg/dL — ABNORMAL HIGH (ref 70–99)
Potassium: 4.5 mmol/L (ref 3.5–5.2)
Sodium: 140 mmol/L (ref 134–144)
Total Protein: 6.8 g/dL (ref 6.0–8.5)
eGFR: 101 mL/min/{1.73_m2} (ref >59)

## 2023-10-20 LAB — URINALYSIS, ROUTINE W REFLEX MICROSCOPIC
Bilirubin, UA: NEGATIVE
Glucose, UA: NEGATIVE
Ketones, UA: NEGATIVE
Nitrite, UA: NEGATIVE
Protein,UA: NEGATIVE
RBC, UA: NEGATIVE
Specific Gravity, UA: 1.021 (ref 1.005–1.030)
Urobilinogen, Ur: 0.2 mg/dL (ref 0.2–1.0)
pH, UA: 5.5 (ref 5.0–7.5)

## 2023-10-20 LAB — LIPID PANEL
Chol/HDL Ratio: 4.5 ratio — ABNORMAL HIGH (ref 0.0–4.4)
Cholesterol, Total: 243 mg/dL — ABNORMAL HIGH (ref 100–199)
HDL: 54 mg/dL (ref >39)
LDL Chol Calc (NIH): 176 mg/dL — ABNORMAL HIGH (ref 0–99)
Triglycerides: 74 mg/dL (ref 0–149)
VLDL Cholesterol Cal: 13 mg/dL (ref 5–40)

## 2023-10-20 LAB — CBC WITH DIFFERENTIAL/PLATELET
Basophils Absolute: 0 10*3/uL (ref 0.0–0.2)
Basos: 1 %
EOS (ABSOLUTE): 0.3 10*3/uL (ref 0.0–0.4)
Eos: 6 %
Hematocrit: 43.5 % (ref 34.0–46.6)
Hemoglobin: 14.4 g/dL (ref 11.1–15.9)
Immature Grans (Abs): 0 10*3/uL (ref 0.0–0.1)
Immature Granulocytes: 0 %
Lymphocytes Absolute: 1.4 10*3/uL (ref 0.7–3.1)
Lymphs: 29 %
MCH: 30.8 pg (ref 26.6–33.0)
MCHC: 33.1 g/dL (ref 31.5–35.7)
MCV: 93 fL (ref 79–97)
Monocytes Absolute: 0.5 10*3/uL (ref 0.1–0.9)
Monocytes: 9 %
Neutrophils Absolute: 2.7 10*3/uL (ref 1.4–7.0)
Neutrophils: 55 %
Platelets: 259 10*3/uL (ref 150–450)
RBC: 4.68 x10E6/uL (ref 3.77–5.28)
RDW: 11.9 % (ref 11.7–15.4)
WBC: 4.9 10*3/uL (ref 3.4–10.8)

## 2023-10-20 LAB — HEMOGLOBIN A1C
Est. average glucose Bld gHb Est-mCnc: 114 mg/dL
Hgb A1c MFr Bld: 5.6 % (ref 4.8–5.6)

## 2023-10-20 LAB — TSH: TSH: 1.48 u[IU]/mL (ref 0.450–4.500)

## 2023-10-20 LAB — MICROSCOPIC EXAMINATION: Casts: NONE SEEN /LPF

## 2023-10-22 ENCOUNTER — Ambulatory Visit: Payer: Self-pay | Admitting: Internal Medicine

## 2023-10-22 DIAGNOSIS — E782 Mixed hyperlipidemia: Secondary | ICD-10-CM

## 2023-12-17 ENCOUNTER — Ambulatory Visit
Admission: RE | Admit: 2023-12-17 | Discharge: 2023-12-17 | Disposition: A | Discharge status: Home / Self Care | Source: Ambulatory Visit | Attending: Internal Medicine | Admitting: Internal Medicine

## 2023-12-17 DIAGNOSIS — Z1231 Encounter for screening mammogram for malignant neoplasm of breast: Secondary | ICD-10-CM | POA: Diagnosis present

## 2024-04-22 ENCOUNTER — Other Ambulatory Visit (HOSPITAL_COMMUNITY)
Admission: RE | Admit: 2024-04-22 | Discharge: 2024-04-22 | Disposition: A | Discharge status: Home / Self Care | Source: Ambulatory Visit | Attending: Internal Medicine | Admitting: Internal Medicine

## 2024-04-22 ENCOUNTER — Encounter: Payer: Self-pay | Admitting: Internal Medicine

## 2024-04-22 ENCOUNTER — Ambulatory Visit: Admitting: Internal Medicine

## 2024-04-22 VITALS — BP 122/74 | HR 77 | Ht 59.0 in | Wt 194.0 lb

## 2024-04-22 DIAGNOSIS — N760 Acute vaginitis: Secondary | ICD-10-CM

## 2024-04-22 DIAGNOSIS — Z23 Encounter for immunization: Secondary | ICD-10-CM

## 2024-04-22 DIAGNOSIS — I10 Essential (primary) hypertension: Secondary | ICD-10-CM | POA: Diagnosis not present

## 2024-04-22 LAB — CERVICOVAGINAL ANCILLARY ONLY
Bacterial Vaginitis (gardnerella): NEGATIVE
Candida Glabrata: NEGATIVE
Candida Vaginitis: POSITIVE — AB
Comment: NEGATIVE
Comment: NEGATIVE
Comment: NEGATIVE
Comment: NEGATIVE
Trichomonas: NEGATIVE

## 2024-04-22 MED ORDER — LISINOPRIL-HYDROCHLOROTHIAZIDE 10-12.5 MG PO TABS: 1.0000 | ORAL_TABLET | Freq: Every day | ORAL | 3 refills | Status: AC

## 2024-04-22 NOTE — Assessment & Plan Note (Signed)
 Well controlled blood pressure today. Current regimen is lisinopril  and hydrochlorothiazide .. No medication side effects noted.

## 2024-04-22 NOTE — Progress Notes (Signed)
 "   Date:  04/22/2024   Name:  Nicole Vargas   DOB:  [birth year]   MRN:  [id number]   Chief Complaint: Hypertension and Vaginal Itching (Little discharge, no color in the discharge, started 1 week ago, not taken any medication, no urinary symptoms )  Hypertension This is a chronic problem. The problem is controlled. Pertinent negatives include no chest pain, headaches, palpitations or shortness of breath. Past treatments include ACE inhibitors and diuretics. The current treatment provides significant improvement.  Vaginal Itching The patient's primary symptoms include genital itching and vaginal discharge. The patient's pertinent negatives include no genital odor, genital rash, missed menses or vaginal bleeding. This is a new problem. The current episode started in the past 7 days. The problem occurs daily. The problem has been unchanged. The patient is experiencing no pain. She is not pregnant. Pertinent negatives include no abdominal pain, constipation, diarrhea, dysuria or headaches. The vaginal discharge was milky. The vaginal bleeding is spotting (last in October).    Review of Systems  Constitutional:  Negative for fatigue and unexpected weight change.  HENT:  Negative for trouble swallowing.   Eyes:  Negative for visual disturbance.  Respiratory:  Negative for cough, chest tightness, shortness of breath and wheezing.   Cardiovascular:  Negative for chest pain, palpitations and leg swelling.  Gastrointestinal:  Negative for abdominal pain, constipation and diarrhea.  Genitourinary:  Positive for vaginal discharge. Negative for dysuria, genital sores and missed menses.  Musculoskeletal:  Negative for arthralgias and myalgias.  Neurological:  Negative for dizziness, weakness, light-headedness and headaches.  Psychiatric/Behavioral:  Negative for dysphoric mood and sleep disturbance. The patient is not nervous/anxious.      Lab Results  Component Value Date   NA 140  10/19/2023   K 4.5 10/19/2023   CO2 19 (L) 10/19/2023   GLUCOSE 100 (H) 10/19/2023   BUN 10 10/19/2023   CREATININE 0.72 10/19/2023   CALCIUM 9.3 10/19/2023   EGFR 101 10/19/2023   GFRNONAA 108 09/19/2019   Lab Results  Component Value Date   CHOL 243 (H) 10/19/2023   HDL 54 10/19/2023   LDLCALC 176 (H) 10/19/2023   TRIG 74 10/19/2023   CHOLHDL 4.5 (H) 10/19/2023   Lab Results  Component Value Date   TSH 1.480 10/19/2023   Lab Results  Component Value Date   HGBA1C 5.6 10/19/2023   Lab Results  Component Value Date   WBC 4.9 10/19/2023   HGB 14.4 10/19/2023   HCT 43.5 10/19/2023   MCV 93 10/19/2023   PLT 259 10/19/2023   Lab Results  Component Value Date   ALT 25 10/19/2023   AST 29 10/19/2023   ALKPHOS 35 (L) 10/19/2023   BILITOT 0.5 10/19/2023   Lab Results  Component Value Date   VD25OH 34.8 10/12/2015     Patient Active Problem List   Diagnosis Date Noted   Claustrophobia 03/09/2021   Motion sickness 03/09/2021   OSA on CPAP 08/04/2020   Hx of basal cell carcinoma 03/03/2020   Sleep disorder 03/03/2020   Perimenopausal symptoms 09/19/2019   Irritable bowel syndrome with diarrhea 01/21/2019   Migraine without aura and without status migrainosus, not intractable 09/12/2017   Arthritis of ankle, right 02/23/2017   S/P breast biopsy, left 10/11/2016   Hyperlipidemia, mixed 08/24/2016   Vitamin D  deficiency 11/24/2014   Prediabetes 11/24/2014   Essential (primary) hypertension 08/06/2014   Obesity, Class III, BMI 40-49.9 (morbid obesity) (HCC) 08/06/2014    Allergies[1]  Past Surgical  History:  Procedure Laterality Date   BREAST BIOPSY Left 10/09/2016   Afffirm Biopsy- bx/clip-neg   CESAREAN SECTION  03/04/2000   CHOLECYSTECTOMY  2006   TUBAL LIGATION  2006    Social History[2]   Medication list has been reviewed and updated.  Active Medications[3]     04/22/2024    7:58 AM 10/19/2023    8:02 AM 04/20/2023    1:31 PM 02/12/2023    11:15 AM  GAD 7 : Generalized Anxiety Score  Nervous, Anxious, on Edge 0 0 0 0  Control/stop worrying 0 0 0 0  Worry too much - different things  1 0 0  Trouble relaxing  0 0 0  Restless  0 0 0  Easily annoyed or irritable  0 1 0  Afraid - awful might happen  0 0 0  Total GAD 7 Score  1 1 0  Anxiety Difficulty  Not difficult at all Not difficult at all Not difficult at all       04/22/2024    7:58 AM 10/19/2023    8:02 AM 04/20/2023    1:30 PM  Depression screen PHQ 2/9  Decreased Interest 0 0 1  Down, Depressed, Hopeless 0 0 1  PHQ - 2 Score 0 0 2  Altered sleeping  0 2  Tired, decreased energy  2 2  Change in appetite  1 3  Feeling bad or failure about yourself   0 2  Trouble concentrating  0 0  Moving slowly or fidgety/restless  0 0  Suicidal thoughts  0 0  PHQ-9 Score  3  11   Difficult doing work/chores  Not difficult at all Not difficult at all     Data saved with a previous flowsheet row definition    BP Readings from Last 3 Encounters:  04/22/24 122/74  10/19/23 118/72  09/28/23 107/74    Physical Exam Vitals and nursing note reviewed.  Constitutional:      General: She is not in acute distress.    Appearance: She is well-developed.  HENT:     Head: Normocephalic and atraumatic.  Cardiovascular:     Rate and Rhythm: Normal rate and regular rhythm.  Pulmonary:     Effort: Pulmonary effort is normal. No respiratory distress.     Breath sounds: No wheezing or rhonchi.  Musculoskeletal:     Cervical back: Normal range of motion.     Right lower leg: No edema.     Left lower leg: No edema.  Skin:    General: Skin is warm and dry.     Findings: No rash.  Neurological:     Mental Status: She is alert and oriented to person, place, and time.  Psychiatric:        Mood and Affect: Mood normal.        Behavior: Behavior normal.     Wt Readings from Last 3 Encounters:  04/22/24 194 lb (88 kg)  10/19/23 206 lb (93.4 kg)  09/28/23 207 lb 14.3 oz (94.3  kg)    BP 122/74   Pulse 77   Ht 4' 11 (1.499 m)   Wt 194 lb (88 kg)   SpO2 97%   BMI 39.18 kg/m   Assessment and Plan:  Problem List Items Addressed This Visit       Unprioritized   Essential (primary) hypertension - Primary (Chronic)   Well controlled blood pressure today. Current regimen is lisinopril  and hydrochlorothiazide . No medication side effects noted.  Relevant Medications   lisinopril -hydrochlorothiazide  (ZESTORETIC ) 10-12.5 MG tablet   Other Visit Diagnoses       Acute vaginitis       symptoms are minor at this time will await Aptima swab to determine treatment   Relevant Orders   Cervicovaginal ancillary only     Encounter for immunization       Relevant Orders   Flu vaccine trivalent PF, 6mos and older(Flulaval,Afluria,Fluarix,Fluzone) (Completed)       Return in about 6 months (around 10/21/2024) for CPX.    Leita HILARIO Adie, MD [facility] [place]           [1]  Allergies Allergen Reactions   Bactrim  [Sulfamethoxazole -Trimethoprim ] Rash   Latex Rash  [2]  Social History Tobacco Use   Smoking status: Never   Smokeless tobacco: Never  Vaping Use   Vaping status: Never Used  Substance Use Topics   Alcohol use: No    Alcohol/week: 0.0 standard drinks of alcohol   Drug use: No  [3]  Current Meds  Medication Sig   Cholecalciferol (VITAMIN D ) 2000 units CAPS Take 1 capsule (2,000 Units total) by mouth daily.   ferrous sulfate  325 (65 FE) MG EC tablet Take 1 tablet (325 mg total) by mouth daily with breakfast.   [DISCONTINUED] lisinopril -hydrochlorothiazide  (ZESTORETIC ) 10-12.5 MG tablet Take 1 tablet by mouth daily.   "

## 2024-04-23 ENCOUNTER — Ambulatory Visit: Payer: Self-pay | Admitting: Internal Medicine

## 2024-04-23 DIAGNOSIS — B3731 Acute candidiasis of vulva and vagina: Secondary | ICD-10-CM

## 2024-04-23 MED ORDER — FLUCONAZOLE 100 MG PO TABS: 100.0000 mg | ORAL_TABLET | ORAL | 0 refills | Status: AC

## 2024-10-20 ENCOUNTER — Encounter: Admitting: Student
